# Patient Record
Sex: Female | Born: 1945
Health system: Southern US, Community
[De-identification: ages and names within clinical notes are randomized; demographics above are authoritative.]

## PROBLEM LIST (undated history)

## (undated) DIAGNOSIS — N3010 Interstitial cystitis (chronic) without hematuria: Secondary | ICD-10-CM

## (undated) DIAGNOSIS — L900 Lichen sclerosus et atrophicus: Secondary | ICD-10-CM

## (undated) DIAGNOSIS — M797 Fibromyalgia: Secondary | ICD-10-CM

## (undated) DIAGNOSIS — M8580 Other specified disorders of bone density and structure, unspecified site: Secondary | ICD-10-CM

## (undated) HISTORY — DX: Fibromyalgia: M79.7

## (undated) HISTORY — PX: BILATERAL SALPINGOOPHORECTOMY: SHX1223

## (undated) HISTORY — DX: Lichen sclerosus et atrophicus: L90.0

## (undated) HISTORY — DX: Interstitial cystitis (chronic) without hematuria: N30.10

## (undated) HISTORY — DX: Other specified disorders of bone density and structure, unspecified site: M85.80

## (undated) HISTORY — PX: HYSTEROSCOPY: SHX211

## (undated) HISTORY — PX: BREAST BIOPSY: SHX20

---

## 1998-02-28 ENCOUNTER — Encounter: Admission: RE | Admit: 1998-02-28 | Discharge: 1998-05-29 | Payer: Self-pay | Admitting: Anesthesiology

## 1998-06-12 ENCOUNTER — Encounter: Payer: Self-pay | Admitting: Obstetrics and Gynecology

## 1998-06-12 ENCOUNTER — Ambulatory Visit (HOSPITAL_COMMUNITY): Admission: RE | Admit: 1998-06-12 | Discharge: 1998-06-12 | Payer: Self-pay | Admitting: Obstetrics and Gynecology

## 1998-07-11 ENCOUNTER — Ambulatory Visit (HOSPITAL_BASED_OUTPATIENT_CLINIC_OR_DEPARTMENT_OTHER): Admission: RE | Admit: 1998-07-11 | Discharge: 1998-07-11 | Payer: Self-pay

## 1999-06-03 ENCOUNTER — Ambulatory Visit (HOSPITAL_COMMUNITY): Admission: RE | Admit: 1999-06-03 | Discharge: 1999-06-03 | Payer: Self-pay | Admitting: Obstetrics and Gynecology

## 1999-06-03 ENCOUNTER — Encounter: Payer: Self-pay | Admitting: Obstetrics and Gynecology

## 1999-06-24 ENCOUNTER — Other Ambulatory Visit: Admission: RE | Admit: 1999-06-24 | Discharge: 1999-06-24 | Payer: Self-pay | Admitting: Obstetrics and Gynecology

## 1999-06-27 ENCOUNTER — Ambulatory Visit (HOSPITAL_COMMUNITY): Admission: RE | Admit: 1999-06-27 | Discharge: 1999-06-27 | Payer: Self-pay | Admitting: Gastroenterology

## 2000-06-03 ENCOUNTER — Encounter: Payer: Self-pay | Admitting: Obstetrics and Gynecology

## 2000-06-03 ENCOUNTER — Encounter: Admission: RE | Admit: 2000-06-03 | Discharge: 2000-06-03 | Payer: Self-pay | Admitting: Obstetrics and Gynecology

## 2000-07-15 ENCOUNTER — Other Ambulatory Visit: Admission: RE | Admit: 2000-07-15 | Discharge: 2000-07-15 | Payer: Self-pay | Admitting: Obstetrics and Gynecology

## 2000-11-25 ENCOUNTER — Encounter: Admission: RE | Admit: 2000-11-25 | Discharge: 2000-11-25 | Payer: Self-pay | Admitting: Obstetrics and Gynecology

## 2000-11-25 ENCOUNTER — Encounter: Payer: Self-pay | Admitting: Obstetrics and Gynecology

## 2001-06-07 ENCOUNTER — Encounter: Payer: Self-pay | Admitting: Obstetrics and Gynecology

## 2001-06-07 ENCOUNTER — Encounter: Admission: RE | Admit: 2001-06-07 | Discharge: 2001-06-07 | Payer: Self-pay | Admitting: Obstetrics and Gynecology

## 2001-07-15 ENCOUNTER — Other Ambulatory Visit: Admission: RE | Admit: 2001-07-15 | Discharge: 2001-07-15 | Payer: Self-pay | Admitting: Obstetrics and Gynecology

## 2001-12-13 ENCOUNTER — Encounter: Payer: Self-pay | Admitting: Obstetrics and Gynecology

## 2001-12-13 ENCOUNTER — Encounter: Admission: RE | Admit: 2001-12-13 | Discharge: 2001-12-13 | Payer: Self-pay | Admitting: Obstetrics and Gynecology

## 2002-05-18 ENCOUNTER — Encounter: Payer: Self-pay | Admitting: Internal Medicine

## 2002-05-18 ENCOUNTER — Encounter: Admission: RE | Admit: 2002-05-18 | Discharge: 2002-05-18 | Payer: Self-pay | Admitting: Internal Medicine

## 2002-05-19 ENCOUNTER — Ambulatory Visit (HOSPITAL_COMMUNITY): Admission: RE | Admit: 2002-05-19 | Discharge: 2002-05-19 | Payer: Self-pay | Admitting: Gastroenterology

## 2002-06-12 ENCOUNTER — Encounter: Payer: Self-pay | Admitting: Obstetrics and Gynecology

## 2002-06-12 ENCOUNTER — Encounter: Admission: RE | Admit: 2002-06-12 | Discharge: 2002-06-12 | Payer: Self-pay | Admitting: Obstetrics and Gynecology

## 2002-07-17 ENCOUNTER — Other Ambulatory Visit: Admission: RE | Admit: 2002-07-17 | Discharge: 2002-07-17 | Payer: Self-pay | Admitting: Obstetrics and Gynecology

## 2002-07-27 HISTORY — PX: COMBINED HYSTEROSCOPY DIAGNOSTIC / D&C: SUR297

## 2002-12-14 ENCOUNTER — Encounter: Admission: RE | Admit: 2002-12-14 | Discharge: 2002-12-14 | Payer: Self-pay | Admitting: Obstetrics and Gynecology

## 2002-12-14 ENCOUNTER — Encounter: Payer: Self-pay | Admitting: Obstetrics and Gynecology

## 2003-01-17 ENCOUNTER — Encounter: Admission: RE | Admit: 2003-01-17 | Discharge: 2003-01-17 | Payer: Self-pay | Admitting: Internal Medicine

## 2003-01-17 ENCOUNTER — Encounter: Payer: Self-pay | Admitting: Internal Medicine

## 2003-01-25 ENCOUNTER — Observation Stay (HOSPITAL_COMMUNITY): Admission: EM | Admit: 2003-01-25 | Discharge: 2003-01-26 | Payer: Self-pay | Admitting: Obstetrics and Gynecology

## 2003-01-25 ENCOUNTER — Encounter (INDEPENDENT_AMBULATORY_CARE_PROVIDER_SITE_OTHER): Payer: Self-pay | Admitting: Specialist

## 2003-02-22 ENCOUNTER — Ambulatory Visit (HOSPITAL_COMMUNITY): Admission: RE | Admit: 2003-02-22 | Discharge: 2003-02-22 | Payer: Self-pay | Admitting: Gastroenterology

## 2003-02-22 ENCOUNTER — Encounter (INDEPENDENT_AMBULATORY_CARE_PROVIDER_SITE_OTHER): Payer: Self-pay | Admitting: Specialist

## 2003-06-14 ENCOUNTER — Encounter: Admission: RE | Admit: 2003-06-14 | Discharge: 2003-06-14 | Payer: Self-pay | Admitting: Obstetrics and Gynecology

## 2003-06-27 ENCOUNTER — Other Ambulatory Visit: Admission: RE | Admit: 2003-06-27 | Discharge: 2003-06-27 | Payer: Self-pay | Admitting: Obstetrics and Gynecology

## 2003-09-03 ENCOUNTER — Encounter: Admission: RE | Admit: 2003-09-03 | Discharge: 2003-09-03 | Payer: Self-pay | Admitting: Internal Medicine

## 2003-09-25 ENCOUNTER — Ambulatory Visit (HOSPITAL_COMMUNITY): Admission: RE | Admit: 2003-09-25 | Discharge: 2003-09-25 | Payer: Self-pay | Admitting: Urology

## 2003-09-25 ENCOUNTER — Encounter (INDEPENDENT_AMBULATORY_CARE_PROVIDER_SITE_OTHER): Payer: Self-pay | Admitting: Specialist

## 2003-09-25 ENCOUNTER — Ambulatory Visit (HOSPITAL_BASED_OUTPATIENT_CLINIC_OR_DEPARTMENT_OTHER): Admission: RE | Admit: 2003-09-25 | Discharge: 2003-09-25 | Payer: Self-pay | Admitting: Urology

## 2003-12-17 ENCOUNTER — Encounter: Admission: RE | Admit: 2003-12-17 | Discharge: 2003-12-17 | Payer: Self-pay | Admitting: Obstetrics and Gynecology

## 2004-06-16 ENCOUNTER — Encounter: Admission: RE | Admit: 2004-06-16 | Discharge: 2004-06-16 | Payer: Self-pay | Admitting: Obstetrics and Gynecology

## 2004-07-01 ENCOUNTER — Other Ambulatory Visit: Admission: RE | Admit: 2004-07-01 | Discharge: 2004-07-01 | Payer: Self-pay | Admitting: Obstetrics and Gynecology

## 2004-07-27 HISTORY — PX: ROTATOR CUFF REPAIR: SHX139

## 2005-06-24 ENCOUNTER — Encounter: Admission: RE | Admit: 2005-06-24 | Discharge: 2005-06-24 | Payer: Self-pay | Admitting: Obstetrics and Gynecology

## 2005-07-02 ENCOUNTER — Other Ambulatory Visit: Admission: RE | Admit: 2005-07-02 | Discharge: 2005-07-02 | Payer: Self-pay | Admitting: Obstetrics and Gynecology

## 2005-07-27 HISTORY — PX: LAPAROSCOPIC ASSISTED VAGINAL HYSTERECTOMY: SHX5398

## 2006-04-05 ENCOUNTER — Encounter (INDEPENDENT_AMBULATORY_CARE_PROVIDER_SITE_OTHER): Payer: Self-pay | Admitting: *Deleted

## 2006-04-05 ENCOUNTER — Ambulatory Visit (HOSPITAL_COMMUNITY): Admission: RE | Admit: 2006-04-05 | Discharge: 2006-04-06 | Payer: Self-pay | Admitting: Obstetrics and Gynecology

## 2006-07-01 ENCOUNTER — Encounter: Admission: RE | Admit: 2006-07-01 | Discharge: 2006-07-01 | Payer: Self-pay | Admitting: Obstetrics and Gynecology

## 2007-06-13 ENCOUNTER — Other Ambulatory Visit: Admission: RE | Admit: 2007-06-13 | Discharge: 2007-06-13 | Payer: Self-pay | Admitting: Obstetrics and Gynecology

## 2007-07-04 ENCOUNTER — Encounter: Admission: RE | Admit: 2007-07-04 | Discharge: 2007-07-04 | Payer: Self-pay | Admitting: Obstetrics and Gynecology

## 2007-08-12 ENCOUNTER — Encounter: Admission: RE | Admit: 2007-08-12 | Discharge: 2007-08-12 | Payer: Self-pay | Admitting: Internal Medicine

## 2008-06-14 ENCOUNTER — Ambulatory Visit: Payer: Self-pay | Admitting: Obstetrics and Gynecology

## 2008-06-14 ENCOUNTER — Other Ambulatory Visit: Admission: RE | Admit: 2008-06-14 | Discharge: 2008-06-14 | Payer: Self-pay | Admitting: Obstetrics and Gynecology

## 2008-06-14 ENCOUNTER — Encounter: Payer: Self-pay | Admitting: Obstetrics and Gynecology

## 2008-07-04 ENCOUNTER — Encounter: Admission: RE | Admit: 2008-07-04 | Discharge: 2008-07-04 | Payer: Self-pay | Admitting: Obstetrics and Gynecology

## 2008-07-16 ENCOUNTER — Encounter: Admission: RE | Admit: 2008-07-16 | Discharge: 2008-07-16 | Payer: Self-pay | Admitting: Internal Medicine

## 2009-07-05 ENCOUNTER — Encounter: Admission: RE | Admit: 2009-07-05 | Discharge: 2009-07-05 | Payer: Self-pay | Admitting: Obstetrics and Gynecology

## 2009-07-08 ENCOUNTER — Ambulatory Visit: Payer: Self-pay | Admitting: Obstetrics and Gynecology

## 2009-07-08 ENCOUNTER — Other Ambulatory Visit: Admission: RE | Admit: 2009-07-08 | Discharge: 2009-07-08 | Payer: Self-pay | Admitting: Obstetrics and Gynecology

## 2009-10-09 ENCOUNTER — Ambulatory Visit: Payer: Self-pay | Admitting: Obstetrics and Gynecology

## 2009-10-18 ENCOUNTER — Ambulatory Visit: Payer: Self-pay | Admitting: Obstetrics and Gynecology

## 2010-01-29 ENCOUNTER — Ambulatory Visit: Payer: Self-pay | Admitting: Obstetrics and Gynecology

## 2010-04-24 ENCOUNTER — Ambulatory Visit (HOSPITAL_COMMUNITY): Admission: RE | Admit: 2010-04-24 | Discharge: 2010-04-24 | Payer: Self-pay | Admitting: Gastroenterology

## 2010-07-07 ENCOUNTER — Encounter
Admission: RE | Admit: 2010-07-07 | Discharge: 2010-07-07 | Payer: Self-pay | Source: Home / Self Care | Attending: Obstetrics and Gynecology | Admitting: Obstetrics and Gynecology

## 2010-07-10 ENCOUNTER — Other Ambulatory Visit
Admission: RE | Admit: 2010-07-10 | Discharge: 2010-07-10 | Payer: Self-pay | Source: Home / Self Care | Admitting: Obstetrics and Gynecology

## 2010-07-10 ENCOUNTER — Ambulatory Visit: Payer: Self-pay | Admitting: Obstetrics and Gynecology

## 2010-10-09 LAB — BASIC METABOLIC PANEL
BUN: 16 mg/dL (ref 6–23)
CO2: 22 mEq/L (ref 19–32)
Calcium: 9 mg/dL (ref 8.4–10.5)
Chloride: 111 mEq/L (ref 96–112)
Creatinine, Ser: 0.83 mg/dL (ref 0.4–1.2)
GFR calc Af Amer: >60 mL/min (ref >60)
GFR calc non Af Amer: >60 mL/min (ref >60)
Glucose, Bld: 99 mg/dL (ref 70–99)
Potassium: 3.6 mEq/L (ref 3.5–5.1)
Sodium: 141 mEq/L (ref 135–145)

## 2010-10-09 LAB — CBC
HCT: 42.5 % (ref 36.0–46.0)
Hemoglobin: 14.2 g/dL (ref 12.0–15.0)
MCH: 30.5 pg (ref 26.0–34.0)
MCHC: 33.5 g/dL (ref 30.0–36.0)
MCV: 91.1 fL (ref 78.0–100.0)
Platelets: 215 10*3/uL (ref 150–400)
RBC: 4.67 MIL/uL (ref 3.87–5.11)
RDW: 13.1 % (ref 11.5–15.5)
WBC: 6.2 10*3/uL (ref 4.0–10.5)

## 2010-12-12 NOTE — Op Note (Signed)
NAME:  Rebecca Newton, BANKER           ACCOUNT NO.:  [id number]   MEDICAL RECORD NO.:  [id number]          PATIENT TYPE:  AMB   LOCATION:  DAY                          FACILITY:  [facility]   PHYSICIAN:  Daniel L. Gottsegen, M.D.DATE OF BIRTH:  [birth year]   DATE OF PROCEDURE:  04/05/2006  DATE OF DISCHARGE:                                 OPERATIVE REPORT   PREOPERATIVE DIAGNOSES:  1. Chronic pelvic pain.  2. Fibroids.  3. Chronic postmenopausal bleeding.   POSTOPERATIVE DIAGNOSES:  1. Chronic pelvic pain.  2. Fibroids.  3. Chronic postmenopausal bleeding.   OPERATION:  Laparoscopic-assisted vaginal hysterectomy, bilateral salpingo-  oophorectomy.   SURGEON:  Daniel L. Eda Paschal, M.D.   FIRST ASSISTANT:  Juan H. Lily Peer, M.D.   FINDINGS AT SURGERY:  The patient had multiple small fibroids enlarging her  uterus; ovaries, fallopian tubes and pelvic peritoneum were free of disease.   PROCEDURE:  After adequate general orotracheal anesthesia, the patient was  placed in the dorsal supine position, prepped and draped in the usual  sterile manner.  Using an OptiVu with direct insertion, the peritoneal  cavity was entered with a 10 mm trocar attached to the laparoscope without  any trauma to the pelvis or the surrounding peritoneal structures.  Two 5 mm  ports were placed laterally and through these, a variety of instrumentation  was placed.  Peritoneal washings were obtained because of the history of  persistent postmenopausal bleeding and then using a bipolar cautery, the IP  ligaments were bipolared and cut.  This was done, isolating them from the  ureters.  Balance of this attachment of the adnexa to the broad ligament  were bipolared and cut.  The bladder flap was developed without difficulty.  At this point, the procedure was finished vaginally.  The patient was  repositioned and 1:200,000 solution of epinephrine and 0.50% Xylocaine was  injected around the cervix, a 360 degree  incision was made around the  cervix.  The bladder was mobilized superiorly as was the posterior  peritoneum.  Posterior peritoneum and vesicouterine fold of peritoneum were  entered without difficulty.  The uterosacral ligaments were clamped, in  clamping them they were shortened and then they were sutured to the vault  laterally for good vault support.  Cardinal ligaments, uterine arteries,  balance of the broad ligament were all clamped, cut and suture ligated with  #1 chromic catgut with the vascular bundle being doubly ligated.  Uterus,  ovaries and tubes were delivered intact and sent to pathology for tissue  diagnosis.  The vaginal cuff was sutured to the peritoneum with a running  locking 0 Vicryl, a modified McCall's enterocele suture was placed with 3-0  Vicryl and the cuff was closed with figure-of-eights of #1 chromic catgut.  Copious irrigation had been done with sterile saline.  The surgeons then  regloved, went back above, copious irrigation was done again, there was  absolutely no bleeding noted so the  procedure was terminated.  The trocars were removed.  The subumbilical  fascial incision was closed with 0 Vicryl and the skin was closed with Steri-  Strips.  The  patient tolerated the procedure well and left the operating  room in satisfactory condition draining clear urine from her Foley catheter.      Daniel L. Eda Paschal, M.D.  Electronically Signed     DLG/MEDQ  D:  04/05/2006  T:  04/06/2006  Job:  [id number]

## 2010-12-12 NOTE — Op Note (Signed)
   NAME:  Rebecca Newton, Rebecca Newton                        ACCOUNT NO.:  [id number]   MEDICAL RECORD NO.:  [id number]                   PATIENT TYPE:  AMB   LOCATION:  ENDO                                 FACILITY:  [facility]   PHYSICIAN:  Danise Edge, M.D.                DATE OF BIRTH:  [birth year]   DATE OF PROCEDURE:  02/22/2003  DATE OF DISCHARGE:                                 OPERATIVE REPORT   REFERRING PHYSICIAN:  Candyce Churn, M.D.   PROCEDURE:  Colonoscopy.   INDICATIONS FOR PROCEDURE:  [name] is a 65 year old female born  01/05/1946.  [name] has unexplained chronic pelvic pain with  nonbloody diarrhea alternating with constipation.  Her May 19, 2002,  esophagogastroduodenoscopy was normal.   ENDOSCOPIST:  Charolett Bumpers, M.D.   PREMEDICATION:  Versed 15 mg, Demerol 100 mg.   PROCEDURE:  After obtaining confirmed consent, [name] was placed in the  left lateral decubitus position.  I administered intravenous Demerol and  intravenous Versed to achieve conscious sedation for the procedure.  The  patient's blood pressure, oxygen saturation, and cardiac rhythm were  monitored throughout the procedure and documented in the medical record.  Anal inspection was normal.  Digital rectal exam was normal.  The Olympus  adjustable pediatric colonoscope was introduced into the rectum and easily  advanced to the cecum.  A normal appearing ileocecal valve was intubated and  the distal ileum inspected.  Colonic preparation for the exam today was  excellent.   Rectum normal.  Sigmoid colon and descending colon normal.  Splenic flexure normal.  Transverse colon normal.  Hepatic flexure normal.  Ascending colon normal.  Cecum and ileocecal valve normal.  Distal ileum normal.   Random colonic biopsies:  Approximately six biopsies were taken from both  the right and left colon and sent to the pathologist to look for signs of  microscopic -  collagenous colitis.    ASSESSMENT:  Normal proctocolonoscopy to the cecum with inspection of the  distal ileum.  Random colonic biopsies pending.                                               Danise Edge, M.D.    MJ/MEDQ  D:  02/22/2003  T:  02/22/2003  Job:  [id number]   cc:   Reuel Boom L. Eda Paschal, M.D.  [address]  [place]  [place] [address]  Fax: [phone]   Candyce Churn, M.D.  [address] [place]  [place] [address]  Fax: [phone]

## 2010-12-12 NOTE — Op Note (Signed)
NAME:  Rebecca Newton, Rebecca Newton                        ACCOUNT NO.:  [id number]   MEDICAL RECORD NO.:  [id number]                   PATIENT TYPE:  AMB   LOCATION:  N[facility]C                                 FACILITY:  [facility]   PHYSICIAN:  Jamison Neighbor, M.D.               DATE OF BIRTH:  20-Jun-1946   DATE OF PROCEDURE:  09/25/2003  DATE OF DISCHARGE:                                 OPERATIVE REPORT   SERVICE:  Urology.   PREOPERATIVE DIAGNOSES:  Chronic pelvic pain, probable interstitial  cystitis.   POSTOPERATIVE DIAGNOSES:  Chronic pelvic pain, probable interstitial  cystitis.   PROCEDURE:  Cystoscopy, urethral calibration, hydrodistention of the  bladder, bladder biopsy with cauterization, Marcaine and Pyridium  installation, Marcaine and Kenalog injection.   SURGEON:  Jamison Neighbor, M.D.   ANESTHESIA:  General.   COMPLICATIONS:  None.   DRAINS:  None.   BRIEF HISTORY:  This 65 year old female has had problems with chronic pelvic  pain. She has had an evaluation with an upper GI as well as colonoscopy and  had a hysteroscopy and resection of some benign tissue. The patient's pelvic  pain is severe enough that it is thought she might have interstitial  cystitis.  Based on initial examination, it was thought that she had  possible interstitial cystitis as well as what appeared to be some evidence  of vulvodynia and pelvic floor dysfunction. The patient did have a low  O'Leary symptom index score with a problem score of 6 and a severity score  of 6 but he had an elevated PUF score of 15 which is suggestive of IC.  The  patient was evaluated with a potassium test and the potassium does appear to  be positive.  We felt that the patient should be started on anesthetic  cocktails as well as a combination of Elmiron and Atarax.   The patient felt that she had not had as much improvement as she would like  so for that reason she has requested a hydrodistention. She hopes to achieve  some  degree of pelvic relief from this as well as confirming the diagnosis.  Once that has been completed, the patient does know that she is likely to  require physical therapy.  She understands the risks and benefits of the  procedure specifically the fact that it is likely that this will increase  her pain temporarily.  Full informed consent was obtained.   DESCRIPTION OF PROCEDURE:  After successful induction of general anesthesia,  the patient was placed in the dorsal lithotomy position and prepped with  Betadine and draped in the usual sterile fashion. Careful bimanual  examination revealed no significant abnormality. The patient really did not  have much in the way of cystocele or rectocele, there are no masses on  bimanual exam, there was no evidence of discharge or vaginitis. The urethra  was palpably normal with no signs of a diverticulum.  The urethra was  calibrated at 65 [place] with female urethral sounds with no evidence of  stenosis or stricture. The cystoscope was inserted, the bladder was  carefully inspected.  It was free of any tumor or stones. Both ureteral  orifices were normal in configuration and location. The bladder was  distended at a pressure of 100 cm of water for 5 minutes. When the bladder  was drained, the bladder capacity was found to be 800 mL which is somewhat  diminished from a normal bladder capacity of 1100 but is somewhat higher  than the average interstitial cystitis bladder capacity of 575.  Modest  glomerulations were seen and these were really quite nonspecific and no  ulcers could be seen. The fact that the patient has been on Elmiron may  limit to some degree the glomerulation formation. A bladder biopsy was taken  and will be sent for mast call analysis. The biopsy site was cauterized with  the Bugbee electrode.  A mixture of Marcaine and Pyridium was left within  the bladder.  Marcaine and Kenalog were injected periurethrally. A B&O  suppository was  inserted. The patient tolerated the procedure well and was  taken to the recovery room in good condition.  She will be sent home with  Demerol since she is allergic to most other pain medications. She will be  given Pyridium plus for symptom management and Levaquin for three days. When  the patient returns will assess the results of the hydrodistention.  It is  anticipated that we will continue the patient on her combination of  medications i.e. the Elmiron and Atarax but we may need to also add  something for neurogenic inflammation such as Elavil or Neurontin.  In  addition, the patient will be started on therapy for pelvic floor  dysfunction.                                               Jamison Neighbor, M.D.    RJE/MEDQ  D:  09/25/2003  T:  09/25/2003  Job:  [id number]   cc:   Candyce Churn, M.D.  [address] [place]  [place] [address]  Fax: [phone]   Danise Edge, M.D.  [address]  [place]  [place] [address]  Fax: [phone]   Rande Brunt. Eda Paschal, M.D.  [address]  [place]  [place] [address]  Fax: [phone]

## 2010-12-12 NOTE — Discharge Summary (Signed)
NAMEANJULIE, Rebecca Newton              ACCOUNT NO.:  [id number]   MEDICAL RECORD NO.:  [id number]          PATIENT TYPE:  OIB   LOCATION:  1604                         FACILITY:  [facility]   PHYSICIAN:  Daniel L. Gottsegen, M.D.DATE OF BIRTH:  [birth year]   DATE OF ADMISSION:  04/05/2006  DATE OF DISCHARGE:  04/06/2006                                 DISCHARGE SUMMARY   The patient is a 65 year old female who was admitted to the hospital with  severe chronic pelvic pain, fibroids, and chronic postmenopausal bleeding  for definitive surgery.  On the day of admission, she underwent a  laparoscopically-assisted vaginal hysterectomy, bilateral salpingo-  oophorectomy.  Postoperatively, she did well and by the first postoperative day she was  ready for discharge.   She was discharged on pain medication.   She will return to the office in 3 weeks.   FINAL PATHOLOGY:  Report revealed a uterus with multiple fibroids,  adenomyosis, ovaries and tubes were benign.   POSTOPERATIVE CONDITION:  Improved.   DISCHARGE DIAGNOSES:  1,  Chronic pelvic pain with multiple leiomyomas and  adenomyosis.  1. Chronic postmenopausal bleeding.   OPERATION:  Laparoscopic-assisted vaginal hysterectomy, bilateral salpingo-  oophorectomy.      Daniel L. Eda Paschal, M.D.  Electronically Signed     DLG/MEDQ  D:  04/27/2006  T:  04/28/2006  Job:  [id number]

## 2010-12-12 NOTE — Op Note (Signed)
Rebecca Newton, Rebecca Newton                        ACCOUNT NO.:  [id number]   MEDICAL RECORD NO.:  [id number]                   PATIENT TYPE:  AMB   LOCATION:  [facility]C                                 FACILITY:  [facility]   PHYSICIAN:  Daniel L. Eda Paschal, M.D.           DATE OF BIRTH:  28-May-1946   DATE OF PROCEDURE:  01/25/2003  DATE OF DISCHARGE:                                 OPERATIVE REPORT   PREOPERATIVE DIAGNOSIS:  Pelvic pain and large endometrial cavity.   POSTOPERATIVE DIAGNOSIS:  1. Pelvic pain and large endometrial cavity.  2. Endometrial polyp.   OPERATION/PROCEDURE:  1. Hysteroscopy.  2. Dilatation and curettage.  3. Resection of endometrial polyp.   SURGEON:  Daniel L. Eda Paschal, M.D.   ANESTHESIA:  General.   INDICATIONS:  The patient is a 65 year old female who presented to her  internist with a history of chronic lower pelvic pain.  Ultrasound was done  which showed several small myomas.  There was no adnexal pathology but her  endometrial cavity was enlarged, not consistent with her history of being  postmenopausal and on no hormonal therapy.  An attempt was made to do an  endometrial biopsy in the office which was unsuccessful because her cervix  was completely stenotic and closed over.  She now enters the hospital for  hysteroscopy and endometrial sampling and removal of any intrauterine  pathology.   FINDINGS:  External vaginal exam was within normal limits.  Cervix is very  stenotic and there is a small dimple where the external os should be but  there is absolutely no opening whatsoever.  Uterus is slightly enlarged with  small myomas.  Adnexa reveal no masses.  The patient has 1.5 degrees of  uterine descensus.  At the time of hysteroscopy, once the patient was  dilated, her cavity appeared normal, consistent with a postmenopausal state  except for a small, what was thought to be an endometrial polyp, coming off  at the top of the fundus near the right  cornual area.  It was approximately  1 cm.  Other than that, there were no other abnormal findings at the time of  hysteroscopy, looking meticulously at both the intrauterine surfaces as well  as the intracervical surfaces.   DESCRIPTION OF PROCEDURE:  After adequate general anesthesia, the patient  was placed in the dorsal lithotomy position and prepped and draped in the  usual sterile manner.  A single-tooth tenaculum was placed in the anterior  lip of the cervix.  Because there was really no opening or any way to start  an opening with a small dilator, a scalpel was taken and incision was made  over the external os dimple.  At this point the patient could be dilated.  Initially it was exceedingly difficult to do so.  Two single-tooth  tenaculums were placed, one at 12 o'clock and one at 6 o'clock to spread out  the tension.  Finally  after approximately 20 minutes of being very slow and  carefully meticulous, she was dilated to #21 [facility] dilator.  At this time a  diagnostic hysteroscopy was performed and the only abnormal finding was a  small polyp at the top of the fundus.  Three percent sorbitol was used to  expand the intrauterine cavity and a camera was used for magnification.  After this was done, because it was felt that it was very difficult to  dilate her any further, an attempt was made to remove the polyp both with  Randall polyp forceps and several different size curets but this was not  successful.  The little bit of endometrial tissue that was obtained was sent  to pathology for tissue diagnosis.  At this point again being careful and  going slowly, she was dilated to a #31 [employer] dilator and then a  hysteroscopic examination was redone with the hysteroscopic resectoscope.  A  90-degree wire loop was used.  Bovie settings were 70 coag, 110 cutting,  blend 1. The polyp was reidentified and it could be removed in pieces with  the resectoscope and was sent to pathology for  tissue diagnosis.  At the  termination of the procedure, there was no bleeding noted.  Estimated blood  loss for the entire procedure was 20 mL with none replaced.  Fluid deficit  was between 200 and 300 mL but there was a leak in the bag and it was felt  that a lot of that was fluid that had gone on the floor.  The patient  tolerated the procedure well and left the operating room in satisfactory  condition.                                                Daniel L. Eda Paschal, M.D.    Tonette Bihari  D:  01/25/2003  T:  01/25/2003  Job:  [id number]

## 2010-12-12 NOTE — Op Note (Signed)
   NAME:  Rebecca Newton, Rebecca Newton                        ACCOUNT NO.:  [id number]   MEDICAL RECORD NO.:  [id number]                   PATIENT TYPE:  AMB   LOCATION:  ENDO                                 FACILITY:  [facility]   PHYSICIAN:  Danise Edge, M.D.                DATE OF BIRTH:  November 13, 1945   DATE OF PROCEDURE:  05/19/2002  DATE OF DISCHARGE:                                 OPERATIVE REPORT   PROCEDURE:  Esophagogastroduodenoscopy.   INDICATIONS:  The patient is a 65 year old female, born 04/22/1946.  The patient is undergoing a diagnostic esophagogastroduodenoscopy to  evaluate upper abdominal discomfort. Dr. Kevan Ny is concerned she may have  pill induced esophageal injury.   ENDOSCOPIST:  Danise Edge, M.D.   PREMEDICATION:  Versed 10 mg, Demerol 100 mg.   ENDOSCOPE:  Olympus gastroscope.   DESCRIPTION OF PROCEDURE:  After informed consent was obtained the patient  was placed in the left lateral decubitus position. I administered  intravenous Demerol and intravenous Versed to achieve conscious sedation for  the procedure. The patient's blood pressure, O2 saturation an cardiac rhythm  were monitored throughout the procedure and documented in the medical  record.   The Olympus gastroscope was passed through the posterior hypopharynx and  into the proximal esophagus without difficulty. The hypopharynx, larynx and  vocal cords appeared normal.     Esophagoscopy:  The proximal mid and lower segments of the esophagus  appeared completely normal. The squamocolumnar junction and esophagogastric  junction are noted at 38 cm from the incisor teeth.   Gastroscopy:  A retroflexed view of the gastric cardia and fundus was  normal. The gastric body, antrum and pylorus appeared normal.   Duodenoscopy:  The duodenal bulb, mid duodenum and distal duodenum appeared  normal.   ASSESSMENT:  Normal esophagogastroduodenoscopy.        Danise Edge, M.D.    MJ/MEDQ  D:  05/19/2002  T:  05/20/2002  Job:  [id number]   cc:   Candyce Churn, MD  [address] [place]  [place] [address]  Fax: [phone]

## 2010-12-12 NOTE — H&P (Signed)
NAMEDORLEEN, KISSEL NO.:  [id number]   MEDICAL RECORD NO.:  [id number]          PATIENT TYPE:   LOCATION:                                 FACILITY:   PHYSICIAN:  Daniel L. Gottsegen, M.D.DATE OF BIRTH:  04/05/2007   DATE OF ADMISSION:  DATE OF DISCHARGE:                                HISTORY & PHYSICAL   CHIEF COMPLAINT:  Chronic pelvic pain, intermittent postmenopausal bleeding.   HISTORY OF PRESENT ILLNESS:  The patient is a 65 year old gravida 1, para 1,  AB 0, who now has over a three year history of chronic pelvic pain  associated with intermittent vaginal bleeding.  Initially when she was seen  for this problem in June 2004, ultrasound revealed three fibroids.  She at  that point was postmenopausal and it was not clear in our mind whether that  was causing the pain as she was not on hormonal replacement therapy.  She  did not have adnexal disease at that point; however, she did have a very  enlarged endometrial stripe for a postmenopausal woman not on estrogen.  She  underwent a hysteroscopy.  Hysteroscopy revealed an endometrial polyp which  was excised.  Postoperatively after the surgery, her pelvic pain continued.  Because of concern as to whether it was adhesive disease, fibroids, or  endometriosis causing it, it was elected to look for other sources of the  pain.  She has had a complete GI evaluation including colonoscopy, all of  which was normal.  She was found to have interstitial cystitis and it was  felt that certainly could be contributing to the above.  She has been under  the care of Dr. Marcelyn Bruins since then.  He has diagnosed her with  interstitial cystitis; however, she has been very well treated.  She can now  differentiate the interstitial cystitis pain from this other chronic pain,  and the other pain continues to grow.  She has been re-scanned.  Her largest  fibroid when she was re-scanned had increased in size, which also was  somewhat disconcerting as a result of the fact she does not take hormonal  replacement therapy.  She has not had intermittent vaginal bleeding over the  past two to three months.  It is associated with this lower abdominal  cramping which seems to come and go, and which again the patient does not  feel feels like her interstitial cystitis at all.  Endometrial biopsy was  done because of the bleeding and it was benign.  Because of the persistence  of the above, we have decided to proceed with laparoscopic-assisted vaginal  hysterectomy, bilateral salpingo-oophorectomy for persistent chronic pain,  non-GI/non-GU, and intermittent vaginal bleeding non-responsive to the above  therapy.  She now enters the hospital for the above.   PAST MEDICAL HISTORY:  The patient has a history of interstitial cystitis  and fibromyalgia.   MEDICATIONS:  Present medications are:  1. Valium 10 mg p.r.n.  2. Elmiron 100 mg, 2 b.i.d.  3. Pyridium.  4. Vytorin.  5. Hydroxyzine HCL 25 mg, 1 at bedtime.  6. Flexeril 10  mg t.i.d. p.r.n.  7. Ambien 10 mg at bedtime.  8. Topamax 100 mg b.i.d. for her migraines.   ALLERGIES:  She is allergic to [facility].   SOCIAL HISTORY:  She is a previous [employer] administrator.  She is a nonsmoker.  She  does drink a little bit of alcohol.   FAMILY HISTORY:  Her mother died of lung cancer.  Other than that, family  history is noncontributory.   REVIEW OF SYSTEMS:  HEENT:  Reveals a history of chronic migraines.  CARDIOVASCULAR:  The patient does have an elevated cholesterol and takes  Statins.  RESPIRATORY:  Negative.  GI:  Negative.  MUSCULOSKELETAL:  Reveals  a history of fibromyalgia.  GU:  Reveals interstitial cystitis.  NEUROLOGICAL/PSYCHIATRIC:  Negative.  ALLERGIC/IMMUNOLOGIC/LYMPHATIC/ENDOCRINE:  Negative.   PHYSICAL EXAMINATION:  GENERAL APPEARANCE:  The patient is a well-developed,  well-nourished female in no acute distress.  VITAL SIGNS:  Her blood pressure is  138/74 mmHg.  Her pulse is 80 and  regular.  Respirations are 16 and non-labored.  She is febrile.  HEENT:  All within normal limits.  NECK:  Supple.  Trachea in the midline.  Thyroid is not enlarged.  LUNGS:  Clear to P&A.  HEART:  No thrills, heaves, or murmurs.  BREASTS:  No masses.  ABDOMEN:  Soft without guarding, rebound, or masses.  PELVIC:  External is normal.  BUS is normal.  Vaginal is normal.  Cervix is  clean, with a Pap showing no atypia.  Uterus is slightly enlarged by small,  multiple myomas.  Adnexa fail to reveal masses.  RECTOVAGINAL:  Confirmatory.   ADMISSION IMPRESSION:  Persistent, chronic postmenopausal pain with  intermittent vaginal bleeding, other sources of the above ruled out.   PLAN:  LVAH, BSO.      Daniel L. Eda Paschal, M.D.  Electronically Signed     DLG/MEDQ  D:  04/05/2006  T:  04/05/2006  Job:  [id number]

## 2011-05-20 ENCOUNTER — Telehealth: Payer: Self-pay | Admitting: *Deleted

## 2011-05-20 ENCOUNTER — Other Ambulatory Visit: Payer: Self-pay | Admitting: *Deleted

## 2011-05-20 DIAGNOSIS — N644 Mastodynia: Secondary | ICD-10-CM

## 2011-05-20 NOTE — Telephone Encounter (Signed)
Patient called and wants an order sent for bilateral diagnostic mammo with ultrasounds.  Patient said she has always had a diagnostic done.  She currently has pain the the right breast around 3 oclock, and pain to the left at the nipple.  She said you have always had a diagnostic done in the past.  Patient due for annual in December.  Can we send order?

## 2011-05-20 NOTE — Telephone Encounter (Signed)
Order sent patient informed

## 2011-05-20 NOTE — Telephone Encounter (Signed)
yes

## 2011-06-24 ENCOUNTER — Encounter: Payer: Self-pay | Admitting: *Deleted

## 2011-06-24 DIAGNOSIS — M797 Fibromyalgia: Secondary | ICD-10-CM | POA: Insufficient documentation

## 2011-06-24 DIAGNOSIS — M8580 Other specified disorders of bone density and structure, unspecified site: Secondary | ICD-10-CM | POA: Insufficient documentation

## 2011-06-24 DIAGNOSIS — N3010 Interstitial cystitis (chronic) without hematuria: Secondary | ICD-10-CM | POA: Insufficient documentation

## 2011-07-10 ENCOUNTER — Ambulatory Visit
Admission: RE | Admit: 2011-07-10 | Discharge: 2011-07-10 | Disposition: A | Discharge status: Home / Self Care | Payer: 59 | Source: Ambulatory Visit | Attending: Obstetrics and Gynecology | Admitting: Obstetrics and Gynecology

## 2011-07-10 ENCOUNTER — Ambulatory Visit: Payer: Self-pay

## 2011-07-10 DIAGNOSIS — N644 Mastodynia: Secondary | ICD-10-CM

## 2011-07-14 ENCOUNTER — Encounter: Payer: Self-pay | Admitting: Obstetrics and Gynecology

## 2011-07-14 ENCOUNTER — Ambulatory Visit (INDEPENDENT_AMBULATORY_CARE_PROVIDER_SITE_OTHER): Payer: 59 | Admitting: Obstetrics and Gynecology

## 2011-07-14 VITALS — BP 120/76 | Ht 62.0 in | Wt 150.0 lb

## 2011-07-14 DIAGNOSIS — M8580 Other specified disorders of bone density and structure, unspecified site: Secondary | ICD-10-CM

## 2011-07-14 DIAGNOSIS — Z01419 Encounter for gynecological examination (general) (routine) without abnormal findings: Secondary | ICD-10-CM

## 2011-07-14 DIAGNOSIS — M949 Disorder of cartilage, unspecified: Secondary | ICD-10-CM

## 2011-07-14 MED ORDER — FLUCONAZOLE 150 MG PO TABS: 150.0000 mg | ORAL_TABLET | Freq: Once | ORAL | Status: AC

## 2011-07-14 NOTE — Progress Notes (Signed)
The patient came back to see me today for her annual GYN exam. She tried [place] last year for her bone loss but had terrible GERD with it. She had originally tried [place] and also had side effects with it. Her dentist did not want her to go on Reclast because every time he fills a cavity she needs a root canal.her last bone density showed a borderline elevated FRAX risk of 19%. She has had no fractures. She takes calcium and vitamin D. We continue to give her diazepam for anxiety and Diflucan when she gets a yeast infection. She is up-to-date on mammograms. She's having no vaginal bleeding or pelvic pain.  Physical examination: Rebecca Newton present HEENT within normal limits. Neck: Thyroid not large. No masses. Supraclavicular nodes: not enlarged. Breasts: Examined in both sitting midline position. No skin changes and no masses. Abdomen: Soft no guarding rebound or masses or hernia. Pelvic: External: Within normal limits. BUS: Within normal limits. Vaginal:within normal limits. Good estrogen effect. No evidence of cystocele rectocele or enterocele. Cervix: clean. Uterus: Normal size and shape. Adnexa: No masses. Rectovaginal exam: Confirmatory and negative. Extremities: Within normal limits.  Assessment: Low bone mass  Plan: For the moment patient declined therapy. She will get her bone density in March and then we will reconsider Reclast pending results. Diazepam 10 mg tablets #50 with four refills.

## 2011-08-20 ENCOUNTER — Telehealth: Payer: Self-pay | Admitting: *Deleted

## 2011-08-20 NOTE — Telephone Encounter (Signed)
Pt was seen on 07/14/11 for annual and give Diazepam 10 mg tablets #50 with four refills & diflucan 150 mg 7 tablets 5 refills. She was also given handwritten Rx. See hard copy chart. Pt said that she knows nonething about this medication? I didn't see in last office note as well. Please advise.

## 2011-08-20 NOTE — Telephone Encounter (Signed)
I assume she is talking about the danazol. It is used for breast tenderness. In reviewing her encounter I do not see that we discussed that would I gave her that prescription. Assuming she is not having breast tenderness she should not get it filled. I'm not sure why she got the prescription. My guess is that it was for another patient and was mistakenly given to her. Please ask her about the breast tenderness and let me know.

## 2011-08-20 NOTE — Telephone Encounter (Signed)
Pt said that she doesn't need rx and will not fill.

## 2011-10-14 DIAGNOSIS — R300 Dysuria: Secondary | ICD-10-CM | POA: Insufficient documentation

## 2011-10-14 DIAGNOSIS — R3915 Urgency of urination: Secondary | ICD-10-CM | POA: Insufficient documentation

## 2011-10-14 DIAGNOSIS — K589 Irritable bowel syndrome without diarrhea: Secondary | ICD-10-CM | POA: Insufficient documentation

## 2011-10-14 DIAGNOSIS — R5382 Chronic fatigue, unspecified: Secondary | ICD-10-CM | POA: Insufficient documentation

## 2011-10-14 DIAGNOSIS — R52 Pain, unspecified: Secondary | ICD-10-CM | POA: Insufficient documentation

## 2011-10-14 DIAGNOSIS — R109 Unspecified abdominal pain: Secondary | ICD-10-CM | POA: Insufficient documentation

## 2011-10-14 DIAGNOSIS — R350 Frequency of micturition: Secondary | ICD-10-CM | POA: Insufficient documentation

## 2011-10-14 DIAGNOSIS — G43709 Chronic migraine without aura, not intractable, without status migrainosus: Secondary | ICD-10-CM | POA: Insufficient documentation

## 2011-12-02 ENCOUNTER — Ambulatory Visit (INDEPENDENT_AMBULATORY_CARE_PROVIDER_SITE_OTHER): Payer: 59

## 2011-12-02 DIAGNOSIS — M8580 Other specified disorders of bone density and structure, unspecified site: Secondary | ICD-10-CM

## 2011-12-02 DIAGNOSIS — M949 Disorder of cartilage, unspecified: Secondary | ICD-10-CM

## 2011-12-09 ENCOUNTER — Encounter: Payer: Self-pay | Admitting: Obstetrics and Gynecology

## 2012-03-15 DIAGNOSIS — N9489 Other specified conditions associated with female genital organs and menstrual cycle: Secondary | ICD-10-CM | POA: Diagnosis not present

## 2012-03-21 DIAGNOSIS — E782 Mixed hyperlipidemia: Secondary | ICD-10-CM | POA: Diagnosis not present

## 2012-03-21 DIAGNOSIS — B009 Herpesviral infection, unspecified: Secondary | ICD-10-CM | POA: Diagnosis not present

## 2012-03-21 DIAGNOSIS — R609 Edema, unspecified: Secondary | ICD-10-CM | POA: Diagnosis not present

## 2012-03-21 DIAGNOSIS — G4700 Insomnia, unspecified: Secondary | ICD-10-CM | POA: Diagnosis not present

## 2012-03-21 DIAGNOSIS — K219 Gastro-esophageal reflux disease without esophagitis: Secondary | ICD-10-CM | POA: Diagnosis not present

## 2012-03-21 DIAGNOSIS — F411 Generalized anxiety disorder: Secondary | ICD-10-CM | POA: Diagnosis not present

## 2012-03-21 DIAGNOSIS — N9489 Other specified conditions associated with female genital organs and menstrual cycle: Secondary | ICD-10-CM | POA: Diagnosis not present

## 2012-03-21 DIAGNOSIS — E669 Obesity, unspecified: Secondary | ICD-10-CM | POA: Diagnosis not present

## 2012-04-12 ENCOUNTER — Ambulatory Visit (INDEPENDENT_AMBULATORY_CARE_PROVIDER_SITE_OTHER): Payer: Medicare Other | Admitting: Obstetrics and Gynecology

## 2012-04-12 ENCOUNTER — Encounter: Payer: Self-pay | Admitting: Obstetrics and Gynecology

## 2012-04-12 DIAGNOSIS — B373 Candidiasis of vulva and vagina: Secondary | ICD-10-CM | POA: Diagnosis not present

## 2012-04-12 DIAGNOSIS — L293 Anogenital pruritus, unspecified: Secondary | ICD-10-CM | POA: Diagnosis not present

## 2012-04-12 DIAGNOSIS — N952 Postmenopausal atrophic vaginitis: Secondary | ICD-10-CM

## 2012-04-12 DIAGNOSIS — N898 Other specified noninflammatory disorders of vagina: Secondary | ICD-10-CM

## 2012-04-12 LAB — WET PREP FOR TRICH, YEAST, CLUE
Clue Cells Wet Prep HPF POC: NONE SEEN
Trich, Wet Prep: NONE SEEN
Yeast Wet Prep HPF POC: NONE SEEN

## 2012-04-12 MED ORDER — ESTROGENS, CONJUGATED 0.625 MG/GM VA CREA: TOPICAL_CREAM | VAGINAL | Status: DC

## 2012-04-12 NOTE — Patient Instructions (Signed)
Return to the office in 2 weeks 

## 2012-04-12 NOTE — Progress Notes (Signed)
Patient came to see me today with a history of vulvar and vaginal itching of several months duration. She went through PCP who diagnosed her with both atrophic vaginitis and yeast vaginitis. The patient used 3 Diflucan prior to that visit and had gotten some relief. She was treated by them with Premarin vaginal cream and nystatin and has had intermittent relief but is still having tremendous vulvar itching. She was also given a prescription for terconazole 3 cream which she did not fill. She's also had some lower abdominal pressure and they thought she might have a urinary tract infection and treat her with Pyridium and she said she would go talk to Dr. Logan Bores about this. So far she has not seen him.  Exam: Beola Cord present. External shows both vulvitis and some leukoplakia on the inner labia. BUS: Within normal limits. Vaginal exam: Some atrophic changes. Also consistent with yeast although wet prep negative.  Assessment: #1. Leukoplakia #2. Atrophic vaginitis #3. Yeast vaginitis  Plan: Terconazole 3 cream. Use externally and internally for 3 days. Patient has prescription. Continue Premarin vaginal cream when done with terconazole. Return in 2 weeks. Hopefully the area that looked like leukoplakia is just related to the yeast. If still present we will biopsy. We will recheck her urine in 2 weeks.

## 2012-04-26 ENCOUNTER — Ambulatory Visit: Payer: Medicare Other | Admitting: Obstetrics and Gynecology

## 2012-04-27 ENCOUNTER — Ambulatory Visit (INDEPENDENT_AMBULATORY_CARE_PROVIDER_SITE_OTHER): Payer: Medicare Other | Admitting: Obstetrics and Gynecology

## 2012-04-27 ENCOUNTER — Ambulatory Visit: Payer: BLUE CROSS/BLUE SHIELD | Admitting: Obstetrics and Gynecology

## 2012-04-27 DIAGNOSIS — R300 Dysuria: Secondary | ICD-10-CM | POA: Diagnosis not present

## 2012-04-27 DIAGNOSIS — R5381 Other malaise: Secondary | ICD-10-CM | POA: Diagnosis not present

## 2012-04-27 DIAGNOSIS — R5383 Other fatigue: Secondary | ICD-10-CM | POA: Diagnosis not present

## 2012-04-27 DIAGNOSIS — M542 Cervicalgia: Secondary | ICD-10-CM | POA: Diagnosis not present

## 2012-04-27 DIAGNOSIS — G4700 Insomnia, unspecified: Secondary | ICD-10-CM | POA: Diagnosis not present

## 2012-04-27 DIAGNOSIS — IMO0002 Reserved for concepts with insufficient information to code with codable children: Secondary | ICD-10-CM

## 2012-04-27 DIAGNOSIS — M5430 Sciatica, unspecified side: Secondary | ICD-10-CM | POA: Diagnosis not present

## 2012-04-27 DIAGNOSIS — N904 Leukoplakia of vulva: Secondary | ICD-10-CM

## 2012-04-27 DIAGNOSIS — L940 Localized scleroderma [morphea]: Secondary | ICD-10-CM | POA: Diagnosis not present

## 2012-04-27 DIAGNOSIS — L988 Other specified disorders of the skin and subcutaneous tissue: Secondary | ICD-10-CM | POA: Diagnosis not present

## 2012-04-27 NOTE — Progress Notes (Addendum)
Patient came to see me today for further followup. She is still having vulvar and vaginal itching. She is using a Premarin cream. She has not seen Dr. Logan Bores.  Exam: Kennon Portela present. External: Leukoplakia is still present on inner  right labia.  Assessment: #1. Leukoplakia #2. Dysuria  Plan: Urinalysis obtained. A biopsy of the leukoplakia was done. The area was prepped and draped in a sterile manner using Betadine. The area was anesthetized with 1% plain Xylocaine. A punch biopsy was obtained. The base was treated with Monsel's to stop the bleeding. She was instructed on postoperative care. We will call her with pathology report and treatment.  Addendum: Patient's biopsy came back showing lichen sclerosis. She will use Temovate ointment twice a day for one week. She knows she can get reoccurrences. Refills given. Patient also had a urinary tract infection. She was treated with ampicillin 500  mg twice a day for 7 days. She will then return for urine culture.

## 2012-04-27 NOTE — Patient Instructions (Signed)
I will call you with report

## 2012-04-28 LAB — URINALYSIS W MICROSCOPIC + REFLEX CULTURE
Bilirubin Urine: NEGATIVE
Glucose, UA: NEGATIVE mg/dL
Ketones, ur: NEGATIVE mg/dL
Protein, ur: NEGATIVE mg/dL
Urobilinogen, UA: 0.2 mg/dL (ref 0.0–1.0)

## 2012-04-30 LAB — URINE CULTURE

## 2012-05-03 DIAGNOSIS — N904 Leukoplakia of vulva: Secondary | ICD-10-CM | POA: Insufficient documentation

## 2012-05-03 MED ORDER — CLOBETASOL PROPIONATE 0.05 % EX OINT: TOPICAL_OINTMENT | Freq: Two times a day (BID) | CUTANEOUS | Status: DC

## 2012-05-03 MED ORDER — AMPICILLIN 500 MG PO CAPS: 500.0000 mg | ORAL_CAPSULE | Freq: Two times a day (BID) | ORAL | Status: DC

## 2012-05-03 NOTE — Addendum Note (Signed)
Addended by: Trellis Paganini on: 05/03/2012 11:11 AM   Modules accepted: Orders

## 2012-05-13 ENCOUNTER — Other Ambulatory Visit: Payer: Self-pay | Admitting: Obstetrics and Gynecology

## 2012-05-16 ENCOUNTER — Telehealth: Payer: Self-pay | Admitting: *Deleted

## 2012-05-16 NOTE — Telephone Encounter (Signed)
Pt called requesting to have diag. Mammogram, pt informed she will need OV, pt will make appointment.

## 2012-05-17 ENCOUNTER — Telehealth: Payer: Self-pay | Admitting: *Deleted

## 2012-05-17 ENCOUNTER — Encounter: Payer: Self-pay | Admitting: Obstetrics and Gynecology

## 2012-05-17 ENCOUNTER — Ambulatory Visit (INDEPENDENT_AMBULATORY_CARE_PROVIDER_SITE_OTHER): Payer: Medicare Other | Admitting: Obstetrics and Gynecology

## 2012-05-17 ENCOUNTER — Other Ambulatory Visit: Payer: Medicare Other

## 2012-05-17 DIAGNOSIS — N6019 Diffuse cystic mastopathy of unspecified breast: Secondary | ICD-10-CM

## 2012-05-17 DIAGNOSIS — N644 Mastodynia: Secondary | ICD-10-CM | POA: Diagnosis not present

## 2012-05-17 DIAGNOSIS — N390 Urinary tract infection, site not specified: Secondary | ICD-10-CM | POA: Diagnosis not present

## 2012-05-17 LAB — URINALYSIS W MICROSCOPIC + REFLEX CULTURE
Casts: NONE SEEN
Glucose, UA: NEGATIVE mg/dL
Leukocytes, UA: NEGATIVE
Protein, ur: NEGATIVE mg/dL
WBC, UA: NONE SEEN WBC/hpf (ref <3)
pH: 5 (ref 5.0–8.0)

## 2012-05-17 NOTE — Telephone Encounter (Signed)
Order placed

## 2012-05-17 NOTE — Progress Notes (Signed)
Patient came to see me today with complaining of extreme breast tenderness diffusely in both breasts. She's also noticed some increasing "density"diffusely in both her breasts. What she means by that is hardening of the tissue. This occurred after she was given progesterone with her estrogen cream by her PCP. She stopped the progesterone. She had her last mammogram in December, 2012. We Have  Treated  her for urinary tract infection and she is now asymptomatic and her urinalysis today was normal.  Exam: Rebecca Newton present.Both breasts were carefully examined in the sitting and lying positions. There are no skin changes. There are no masses palpated.  Assessment: Mastodynia  Plan: Diagnostic mammogram of both breasts.

## 2012-05-17 NOTE — Telephone Encounter (Signed)
Message copied by Aura Camps on Tue May 17, 2012  3:39 PM ------      Message from: Trellis Paganini      Created: Tue May 17, 2012  3:22 PM       Please schedule patient for bilateral diagnostic mammogram for mastodynia and hardening of breast tissue.

## 2012-05-17 NOTE — Patient Instructions (Signed)
We will call you with mammogram time.

## 2012-05-18 ENCOUNTER — Telehealth: Payer: Self-pay | Admitting: *Deleted

## 2012-05-18 MED ORDER — DIAZEPAM 10 MG PO TABS: 10.0000 mg | ORAL_TABLET | Freq: Four times a day (QID) | ORAL | Status: DC | PRN

## 2012-05-18 NOTE — Telephone Encounter (Signed)
Chart in office

## 2012-05-18 NOTE — Addendum Note (Signed)
Addended by: Aura Camps on: 05/18/2012 12:31 PM   Modules accepted: Orders

## 2012-05-18 NOTE — Telephone Encounter (Signed)
Appointment 05/24/12 with breast center

## 2012-05-18 NOTE — Telephone Encounter (Signed)
Gives her 10 mg tablets. Number is 50 with 4 refills. Tell her that should last one year.

## 2012-05-18 NOTE — Telephone Encounter (Signed)
Chart

## 2012-05-18 NOTE — Telephone Encounter (Signed)
rx called in

## 2012-05-18 NOTE — Telephone Encounter (Signed)
Pt calling requesting refill on Valium 10 mg tablets with refills. Pt has annual schedule 07/18/12. Please advise

## 2012-05-24 ENCOUNTER — Ambulatory Visit
Admission: RE | Admit: 2012-05-24 | Discharge: 2012-05-24 | Disposition: A | Discharge status: Home / Self Care | Payer: Medicare Other | Source: Ambulatory Visit | Attending: Obstetrics and Gynecology | Admitting: Obstetrics and Gynecology

## 2012-05-24 DIAGNOSIS — N644 Mastodynia: Secondary | ICD-10-CM | POA: Diagnosis not present

## 2012-05-24 DIAGNOSIS — Z803 Family history of malignant neoplasm of breast: Secondary | ICD-10-CM | POA: Diagnosis not present

## 2012-06-01 DIAGNOSIS — E782 Mixed hyperlipidemia: Secondary | ICD-10-CM | POA: Diagnosis not present

## 2012-06-01 DIAGNOSIS — R609 Edema, unspecified: Secondary | ICD-10-CM | POA: Diagnosis not present

## 2012-06-01 DIAGNOSIS — Z0000 Encounter for general adult medical examination without abnormal findings: Secondary | ICD-10-CM | POA: Diagnosis not present

## 2012-06-01 DIAGNOSIS — Z79899 Other long term (current) drug therapy: Secondary | ICD-10-CM | POA: Diagnosis not present

## 2012-06-01 DIAGNOSIS — Z1331 Encounter for screening for depression: Secondary | ICD-10-CM | POA: Diagnosis not present

## 2012-06-01 DIAGNOSIS — F411 Generalized anxiety disorder: Secondary | ICD-10-CM | POA: Diagnosis not present

## 2012-06-01 DIAGNOSIS — K219 Gastro-esophageal reflux disease without esophagitis: Secondary | ICD-10-CM | POA: Diagnosis not present

## 2012-06-01 DIAGNOSIS — G4700 Insomnia, unspecified: Secondary | ICD-10-CM | POA: Diagnosis not present

## 2012-06-01 DIAGNOSIS — N9489 Other specified conditions associated with female genital organs and menstrual cycle: Secondary | ICD-10-CM | POA: Diagnosis not present

## 2012-07-18 ENCOUNTER — Other Ambulatory Visit (HOSPITAL_COMMUNITY)
Admission: RE | Admit: 2012-07-18 | Discharge: 2012-07-18 | Disposition: A | Discharge status: Home / Self Care | Payer: Medicare Other | Source: Ambulatory Visit | Attending: Obstetrics and Gynecology | Admitting: Obstetrics and Gynecology

## 2012-07-18 ENCOUNTER — Encounter: Payer: Self-pay | Admitting: Obstetrics and Gynecology

## 2012-07-18 ENCOUNTER — Ambulatory Visit (INDEPENDENT_AMBULATORY_CARE_PROVIDER_SITE_OTHER): Payer: Medicare Other | Admitting: Obstetrics and Gynecology

## 2012-07-18 VITALS — BP 124/78 | Ht 63.0 in | Wt 166.0 lb

## 2012-07-18 DIAGNOSIS — M949 Disorder of cartilage, unspecified: Secondary | ICD-10-CM | POA: Diagnosis not present

## 2012-07-18 DIAGNOSIS — Z124 Encounter for screening for malignant neoplasm of cervix: Secondary | ICD-10-CM | POA: Insufficient documentation

## 2012-07-18 DIAGNOSIS — N6019 Diffuse cystic mastopathy of unspecified breast: Secondary | ICD-10-CM | POA: Diagnosis not present

## 2012-07-18 DIAGNOSIS — N390 Urinary tract infection, site not specified: Secondary | ICD-10-CM

## 2012-07-18 DIAGNOSIS — N952 Postmenopausal atrophic vaginitis: Secondary | ICD-10-CM | POA: Diagnosis not present

## 2012-07-18 DIAGNOSIS — L940 Localized scleroderma [morphea]: Secondary | ICD-10-CM

## 2012-07-18 DIAGNOSIS — L900 Lichen sclerosus et atrophicus: Secondary | ICD-10-CM

## 2012-07-18 DIAGNOSIS — N871 Moderate cervical dysplasia: Secondary | ICD-10-CM

## 2012-07-18 DIAGNOSIS — M8580 Other specified disorders of bone density and structure, unspecified site: Secondary | ICD-10-CM

## 2012-07-18 MED ORDER — CLOBETASOL PROPIONATE 0.05 % EX OINT: TOPICAL_OINTMENT | Freq: Two times a day (BID) | CUTANEOUS | Status: DC

## 2012-07-18 MED ORDER — DIAZEPAM 10 MG PO TABS: 10.0000 mg | ORAL_TABLET | Freq: Four times a day (QID) | ORAL | Status: DC | PRN

## 2012-07-18 NOTE — Addendum Note (Signed)
Addended by: Dayna Barker on: 07/18/2012 09:25 AM   Modules accepted: Orders

## 2012-07-18 NOTE — Progress Notes (Signed)
Patient came to see me today for further followup. In October she had an area of leukoplakia with itching. Vulvar biopsy showed lichen sclerosis. It has responded to Temovate. She also had a urinary tract infection which responded to antibiotics. Followup urinalysis was normal. Patient is currently without dysuria, frequency or urgency. She has no urinary incontinence. She is using Premarin vaginal cream for atrophic vaginitis with excellent results. She just had a normal mammogram. She does have fibrocystic disease of her breast. She has no masses or mastodynia currently. Her last bone density was 2013 and showed stable osteopenia without an elevated fracture risk. She previously used biphosphonate  Therapy. She has had no fractures. Greater than 30 years ago she had cervical dysplasia which responded to cryosurgery. She has had normal Pap smears since then. Her last Pap smear was 2011. We give her Valium for stress. In 2007 she had a laparoscopic-assisted vaginal hysterectomy and bilateral salpingo-oophorectomy for adenomyosis and fibroids which were symptomatic. She is having no vaginal bleeding. She is having no pelvic pain.  ROS: 12 systems reviewed done. Pertinent positives above. Other positives include interstitial cystitis and fibromyalgia.  HEENT: Within normal limits.Kennon Portela present. Neck: No masses. Supraclavicular lymph nodes: Not enlarged. Breasts: Examined in both sitting and lying position. Symmetrical without skin changes or masses. Abdomen: Soft no masses guarding or rebound. No hernias. Pelvic: External within normal limits. BUS within normal limits. Vaginal examination shows good estrogen effect, no cystocele enterocele or rectocele. Cervix and uterus absent. Adnexa within normal limits. Rectovaginal confirmatory. Extremities within normal limits.  Assessment: #1. Atrophic vaginitis #2. Osteopenia #3. Fibrocystic breast disease #4. Lichen sclerosis #5. Urinary tract infection  now resolved #6. CIN.  Plan: Continue mammograms yearly-start 3-D. Continue periodic bone densities. Continue Premarin vaginal cream. Continue Temovate when necessary. Continue Valium. Pap done.The new Pap smear guidelines were discussed with the patient.

## 2012-07-18 NOTE — Patient Instructions (Signed)
Get a 3-D mammogram in 2014.

## 2012-09-13 DIAGNOSIS — R5381 Other malaise: Secondary | ICD-10-CM | POA: Diagnosis not present

## 2012-09-13 DIAGNOSIS — M545 Low back pain: Secondary | ICD-10-CM | POA: Diagnosis not present

## 2012-09-13 DIAGNOSIS — M542 Cervicalgia: Secondary | ICD-10-CM | POA: Diagnosis not present

## 2012-09-13 DIAGNOSIS — R5383 Other fatigue: Secondary | ICD-10-CM | POA: Diagnosis not present

## 2012-09-13 DIAGNOSIS — IMO0001 Reserved for inherently not codable concepts without codable children: Secondary | ICD-10-CM | POA: Diagnosis not present

## 2012-10-03 DIAGNOSIS — R609 Edema, unspecified: Secondary | ICD-10-CM | POA: Diagnosis not present

## 2012-10-03 DIAGNOSIS — G4700 Insomnia, unspecified: Secondary | ICD-10-CM | POA: Diagnosis not present

## 2012-10-03 DIAGNOSIS — E669 Obesity, unspecified: Secondary | ICD-10-CM | POA: Diagnosis not present

## 2012-11-02 DIAGNOSIS — N94819 Vulvodynia, unspecified: Secondary | ICD-10-CM | POA: Diagnosis not present

## 2012-11-02 DIAGNOSIS — IMO0001 Reserved for inherently not codable concepts without codable children: Secondary | ICD-10-CM | POA: Diagnosis not present

## 2012-11-02 DIAGNOSIS — G43709 Chronic migraine without aura, not intractable, without status migrainosus: Secondary | ICD-10-CM | POA: Diagnosis not present

## 2012-11-02 DIAGNOSIS — R3915 Urgency of urination: Secondary | ICD-10-CM | POA: Diagnosis not present

## 2012-11-02 DIAGNOSIS — R300 Dysuria: Secondary | ICD-10-CM | POA: Diagnosis not present

## 2012-11-02 DIAGNOSIS — R5382 Chronic fatigue, unspecified: Secondary | ICD-10-CM | POA: Diagnosis not present

## 2012-11-02 DIAGNOSIS — R350 Frequency of micturition: Secondary | ICD-10-CM | POA: Diagnosis not present

## 2012-11-02 DIAGNOSIS — K589 Irritable bowel syndrome without diarrhea: Secondary | ICD-10-CM | POA: Diagnosis not present

## 2012-12-23 DIAGNOSIS — E782 Mixed hyperlipidemia: Secondary | ICD-10-CM | POA: Diagnosis not present

## 2012-12-30 DIAGNOSIS — IMO0001 Reserved for inherently not codable concepts without codable children: Secondary | ICD-10-CM | POA: Diagnosis not present

## 2013-01-05 ENCOUNTER — Other Ambulatory Visit: Payer: Self-pay | Admitting: Obstetrics and Gynecology

## 2013-01-05 NOTE — Telephone Encounter (Signed)
Former patient of Dr. Timoteo Expose and is current with [facility].  At 07/18/12 [facility] he wrote "We give her Valium for stress.".

## 2013-01-06 NOTE — Telephone Encounter (Signed)
CALLED INTO PHARMACY

## 2013-01-23 ENCOUNTER — Ambulatory Visit (INDEPENDENT_AMBULATORY_CARE_PROVIDER_SITE_OTHER): Payer: Medicare Other | Admitting: Gynecology

## 2013-01-23 ENCOUNTER — Encounter: Payer: Self-pay | Admitting: Gynecology

## 2013-01-23 DIAGNOSIS — L940 Localized scleroderma [morphea]: Secondary | ICD-10-CM | POA: Diagnosis not present

## 2013-01-23 DIAGNOSIS — N952 Postmenopausal atrophic vaginitis: Secondary | ICD-10-CM

## 2013-01-23 DIAGNOSIS — L293 Anogenital pruritus, unspecified: Secondary | ICD-10-CM

## 2013-01-23 DIAGNOSIS — L900 Lichen sclerosus et atrophicus: Secondary | ICD-10-CM

## 2013-01-23 DIAGNOSIS — L292 Pruritus vulvae: Secondary | ICD-10-CM

## 2013-01-23 MED ORDER — CLOBETASOL PROPIONATE 0.05 % EX CREA: TOPICAL_CREAM | CUTANEOUS | Status: DC

## 2013-01-23 MED ORDER — FLUCONAZOLE 200 MG PO TABS: 200.0000 mg | ORAL_TABLET | Freq: Every day | ORAL | Status: DC

## 2013-01-23 NOTE — Progress Notes (Signed)
Patient presents complaining of vaginal itching and irritation. She has a history of recurrent yeast infections and does take Diflucan intermittently. Also biopsy proven lichen sclerosis although has not used her Temovate cream recently. Lasley history of atrophic vaginitis and was prescribed Premarin vaginal cream but admits to not using it routinely. Most recent episode started several days ago seems to be getting worse. Has tried over-the-counter athlete's foot cream externally but continues to have irritation.  Exam was Administrator, [employer] vagina with atrophic changes. Classic blanching white lichen sclerosis changes on the perineal body and lower labia minora bilaterally. No significant discharge. Mild cystocele. Cuff well supported. Mild rectocele. Bimanual without masses or tenderness.  Assessment and plan: Perineal vulvar itching patient was lichen sclerosis and history of recurrent yeast. No wet prep done his on going to cover her regardless with Diflucan 200 daily x3 days. #10 pills given to have available for use in the future. Temovate 0.05% cream nightly for 3-4 weeks and then taper and we'll see how she does with this. I discussed the use of Premarin. She does not chronically have vaginal dryness or dyspareunia. Need to use twice to 3 times weekly regardless to build up the effect was discussed and she does not want to do this and she will stop the cream.

## 2013-01-23 NOTE — Patient Instructions (Signed)
Take Diflucan pill daily for 3 days. Save the rest indication of recurrent yeast type symptoms. Upon Temovate cream nightly for 3-4 weeks then taper. Followup if your symptoms persist or recur. Followup in December when you're due for your annual.

## 2013-02-22 ENCOUNTER — Ambulatory Visit (INDEPENDENT_AMBULATORY_CARE_PROVIDER_SITE_OTHER): Payer: Medicare Other | Admitting: Gynecology

## 2013-02-22 ENCOUNTER — Encounter: Payer: Self-pay | Admitting: Gynecology

## 2013-02-22 DIAGNOSIS — R82998 Other abnormal findings in urine: Secondary | ICD-10-CM | POA: Diagnosis not present

## 2013-02-22 DIAGNOSIS — N760 Acute vaginitis: Secondary | ICD-10-CM

## 2013-02-22 DIAGNOSIS — L293 Anogenital pruritus, unspecified: Secondary | ICD-10-CM

## 2013-02-22 DIAGNOSIS — L940 Localized scleroderma [morphea]: Secondary | ICD-10-CM

## 2013-02-22 DIAGNOSIS — L900 Lichen sclerosus et atrophicus: Secondary | ICD-10-CM

## 2013-02-22 DIAGNOSIS — N898 Other specified noninflammatory disorders of vagina: Secondary | ICD-10-CM

## 2013-02-22 LAB — WET PREP FOR TRICH, YEAST, CLUE: Clue Cells Wet Prep HPF POC: NONE SEEN

## 2013-02-22 MED ORDER — TERCONAZOLE 0.4 % VA CREA: 1.0000 | TOPICAL_CREAM | Freq: Every day | VAGINAL | Status: DC

## 2013-02-22 NOTE — Progress Notes (Signed)
Patient presents complaining of persistent vulvar itching and irritation. Having intense itching particularly at night. Already uses Atarax 50 mg at bedtime. Is on Temovate cream nightly and took Diflucan 200 mg for 3 days and then repeated a course last week but had persistent itching. Says it feels just like when she has yeast infections.  Exam was Administrator, [employer] vagina with whitish skin changes along both labia minora and perineal body consistent with her diagnosis of lichen sclerosis. Generalized atrophic changes. Vagina normal scant discharge. Bimanual without masses or tenderness. Rectovaginal exam is normal.  Assessment and plan: Wet prep is negative. Persistent intense vaginal itching. Had been on Premarin vaginal cream but did not feel that this helped. Recommend Terazol 7 day cream internally and increasing her Temovate to twice daily. Followup if symptoms persist.

## 2013-02-22 NOTE — Patient Instructions (Signed)
Use Terazol vaginal cream internally for 7 nights. Use Temovate cream externally twice daily. Sitz baths in cool water with hair dryer drying afterwards.

## 2013-02-23 LAB — URINALYSIS W MICROSCOPIC + REFLEX CULTURE
Casts: NONE SEEN
Crystals: NONE SEEN
Nitrite: NEGATIVE
Specific Gravity, Urine: 1.023 (ref 1.005–1.030)
Squamous Epithelial / LPF: NONE SEEN
Urobilinogen, UA: 0.2 mg/dL (ref 0.0–1.0)
pH: 5.5 (ref 5.0–8.0)

## 2013-02-27 ENCOUNTER — Telehealth: Payer: Self-pay

## 2013-02-27 NOTE — Telephone Encounter (Signed)
I do not think anything more going to do is go to dramatically improve it overnight. She needs to be using the Temovate cream twice daily which may take at least a week to 2 weeks to kick in. The only thing more I can add would be after the Terazol is over to retry the Premarin cream but again using this would take several weeks to get a good effect because it needs to affect the growth of the skin which does not happen overnight.

## 2013-02-27 NOTE — Telephone Encounter (Signed)
Patient is using medications as prescribed and still has several days left to use the Terazol 7 but states she is miserable. She said for a day it seemed like it was getting better but yesterday started worsening and today is miserable. Took 4 Benadryl earlier trying to get the itching to subside. She asked if she should take more Atarax. She takes 2 hs already.   She said she had not been to the pool and had been trying to stay cool and dry.  She asked me to see what you recommend because nothing seems to be helping.

## 2013-02-27 NOTE — Telephone Encounter (Signed)
Patient informed. 

## 2013-03-02 ENCOUNTER — Telehealth: Payer: Self-pay

## 2013-03-02 ENCOUNTER — Ambulatory Visit (INDEPENDENT_AMBULATORY_CARE_PROVIDER_SITE_OTHER): Payer: Medicare Other | Admitting: Gynecology

## 2013-03-02 ENCOUNTER — Encounter: Payer: Self-pay | Admitting: Gynecology

## 2013-03-02 DIAGNOSIS — N760 Acute vaginitis: Secondary | ICD-10-CM

## 2013-03-02 LAB — WET PREP FOR TRICH, YEAST, CLUE
Clue Cells Wet Prep HPF POC: NONE SEEN
Trich, Wet Prep: NONE SEEN
Yeast Wet Prep HPF POC: NONE SEEN

## 2013-03-02 MED ORDER — METRONIDAZOLE 500 MG PO TABS: 500.0000 mg | ORAL_TABLET | Freq: Two times a day (BID) | ORAL | Status: DC

## 2013-03-02 MED ORDER — CLINDAMYCIN PHOSPHATE 2 % VA CREA: 1.0000 | TOPICAL_CREAM | Freq: Every day | VAGINAL | Status: DC

## 2013-03-02 MED ORDER — FLUCONAZOLE 200 MG PO TABS: 200.0000 mg | ORAL_TABLET | Freq: Every day | ORAL | Status: DC

## 2013-03-02 NOTE — Progress Notes (Signed)
Patient presents with persistent vaginal itching. Says it feels inside not outside. Complex history to include atrophic vaginitis and lichen sclerosis. Had used Premarin previously but stopped because she didn't think it was helping. Initially treated with Diflucan 200 mg x3 days and Temovate cream nightly. Subsequently treated with Terazol 7 day cream and Temovate twice daily. Said that the intense itching continues and it feels deep inside. Takes atarax at bedtime and has added Benadryl but still with a lot of itching.  Exam Kim Assistant Pelvic external BUS vagina with erythema along the vaginal walls. No external evidence of vulvitis. Cream also noted in the vagina. Bimanual without masses or tenderness  Assessment and plan: Persistent vaginitis. Continue with Temovate cream nightly. Cover with Flagyl 500 mg twice a day for bacterial vaginosis and Diflucan 200 mg daily for 5 days. Use medicated douche x1, followup if symptoms persist or recur. Alcohol avoidance reviewed.

## 2013-03-02 NOTE — Telephone Encounter (Signed)
She picked up Rx's and had a Clindamycin Vag Cm. Rx.  She also had the Diflucan Rx and Metronidazole Rx.  She wanted to make sure you wanted her to have the Clindamycin.  I did not see it on her medication list at all.

## 2013-03-02 NOTE — Telephone Encounter (Signed)
Just FYI.  When you order and sign it it goes on to pharmacy.  Even if you discontinue it in the chart you will need to let us know to call the pharmacy and tell them you cancelled it. They don't see that part.   Patient informed. She will take it back to the pharmacy.

## 2013-03-02 NOTE — Patient Instructions (Signed)
Take Diflucan pill daily for 5 days. Flagyl twice daily for 7 days, avoid alcohol while taking. Use medicated douche x1. Followup if symptoms persist, worsen or recur.

## 2013-03-02 NOTE — Addendum Note (Signed)
Addended by: Dayna Barker on: 03/02/2013 02:01 PM   Modules accepted: Orders

## 2013-03-02 NOTE — Telephone Encounter (Signed)
I thought I canceled the clindamycin order. I do not want her using the Cleocin  but just the two oral medications

## 2013-03-23 DIAGNOSIS — IMO0001 Reserved for inherently not codable concepts without codable children: Secondary | ICD-10-CM | POA: Diagnosis not present

## 2013-03-23 DIAGNOSIS — M545 Low back pain: Secondary | ICD-10-CM | POA: Diagnosis not present

## 2013-03-23 DIAGNOSIS — M542 Cervicalgia: Secondary | ICD-10-CM | POA: Diagnosis not present

## 2013-03-23 DIAGNOSIS — G4700 Insomnia, unspecified: Secondary | ICD-10-CM | POA: Diagnosis not present

## 2013-04-03 DIAGNOSIS — D485 Neoplasm of uncertain behavior of skin: Secondary | ICD-10-CM | POA: Diagnosis not present

## 2013-04-04 ENCOUNTER — Other Ambulatory Visit: Payer: Self-pay

## 2013-04-04 DIAGNOSIS — Z1231 Encounter for screening mammogram for malignant neoplasm of breast: Secondary | ICD-10-CM

## 2013-04-17 DIAGNOSIS — M545 Low back pain: Secondary | ICD-10-CM | POA: Diagnosis not present

## 2013-04-17 DIAGNOSIS — M542 Cervicalgia: Secondary | ICD-10-CM | POA: Diagnosis not present

## 2013-04-24 ENCOUNTER — Other Ambulatory Visit: Payer: Self-pay | Admitting: Orthopaedic Surgery

## 2013-04-24 ENCOUNTER — Ambulatory Visit
Admission: RE | Admit: 2013-04-24 | Discharge: 2013-04-24 | Disposition: A | Discharge status: Home / Self Care | Payer: Medicare Other | Source: Ambulatory Visit | Attending: Orthopaedic Surgery | Admitting: Orthopaedic Surgery

## 2013-04-24 DIAGNOSIS — R52 Pain, unspecified: Secondary | ICD-10-CM

## 2013-04-24 DIAGNOSIS — M25539 Pain in unspecified wrist: Secondary | ICD-10-CM | POA: Diagnosis not present

## 2013-04-24 DIAGNOSIS — R609 Edema, unspecified: Secondary | ICD-10-CM

## 2013-04-24 DIAGNOSIS — S52599A Other fractures of lower end of unspecified radius, initial encounter for closed fracture: Secondary | ICD-10-CM | POA: Diagnosis not present

## 2013-05-09 DIAGNOSIS — S52599A Other fractures of lower end of unspecified radius, initial encounter for closed fracture: Secondary | ICD-10-CM | POA: Diagnosis not present

## 2013-05-12 DIAGNOSIS — S52599A Other fractures of lower end of unspecified radius, initial encounter for closed fracture: Secondary | ICD-10-CM | POA: Diagnosis not present

## 2013-05-29 ENCOUNTER — Ambulatory Visit
Admission: RE | Admit: 2013-05-29 | Discharge: 2013-05-29 | Disposition: A | Discharge status: Home / Self Care | Payer: Medicare Other | Source: Ambulatory Visit

## 2013-05-29 DIAGNOSIS — Z1231 Encounter for screening mammogram for malignant neoplasm of breast: Secondary | ICD-10-CM | POA: Diagnosis not present

## 2013-06-05 DIAGNOSIS — B009 Herpesviral infection, unspecified: Secondary | ICD-10-CM | POA: Diagnosis not present

## 2013-06-05 DIAGNOSIS — IMO0001 Reserved for inherently not codable concepts without codable children: Secondary | ICD-10-CM | POA: Diagnosis not present

## 2013-06-05 DIAGNOSIS — E782 Mixed hyperlipidemia: Secondary | ICD-10-CM | POA: Diagnosis not present

## 2013-06-06 DIAGNOSIS — S52599A Other fractures of lower end of unspecified radius, initial encounter for closed fracture: Secondary | ICD-10-CM | POA: Diagnosis not present

## 2013-06-12 DIAGNOSIS — M6281 Muscle weakness (generalized): Secondary | ICD-10-CM | POA: Diagnosis not present

## 2013-06-12 DIAGNOSIS — M2550 Pain in unspecified joint: Secondary | ICD-10-CM | POA: Diagnosis not present

## 2013-06-12 DIAGNOSIS — M25639 Stiffness of unspecified wrist, not elsewhere classified: Secondary | ICD-10-CM | POA: Diagnosis not present

## 2013-06-12 DIAGNOSIS — M7989 Other specified soft tissue disorders: Secondary | ICD-10-CM | POA: Diagnosis not present

## 2013-06-15 DIAGNOSIS — M2550 Pain in unspecified joint: Secondary | ICD-10-CM | POA: Diagnosis not present

## 2013-06-15 DIAGNOSIS — M7989 Other specified soft tissue disorders: Secondary | ICD-10-CM | POA: Diagnosis not present

## 2013-06-15 DIAGNOSIS — M6281 Muscle weakness (generalized): Secondary | ICD-10-CM | POA: Diagnosis not present

## 2013-06-15 DIAGNOSIS — M25639 Stiffness of unspecified wrist, not elsewhere classified: Secondary | ICD-10-CM | POA: Diagnosis not present

## 2013-06-18 DIAGNOSIS — M2550 Pain in unspecified joint: Secondary | ICD-10-CM | POA: Diagnosis not present

## 2013-06-18 DIAGNOSIS — M7989 Other specified soft tissue disorders: Secondary | ICD-10-CM | POA: Diagnosis not present

## 2013-06-18 DIAGNOSIS — M25639 Stiffness of unspecified wrist, not elsewhere classified: Secondary | ICD-10-CM | POA: Diagnosis not present

## 2013-06-18 DIAGNOSIS — M6281 Muscle weakness (generalized): Secondary | ICD-10-CM | POA: Diagnosis not present

## 2013-06-19 DIAGNOSIS — M6281 Muscle weakness (generalized): Secondary | ICD-10-CM | POA: Diagnosis not present

## 2013-06-19 DIAGNOSIS — M7989 Other specified soft tissue disorders: Secondary | ICD-10-CM | POA: Diagnosis not present

## 2013-06-19 DIAGNOSIS — M25639 Stiffness of unspecified wrist, not elsewhere classified: Secondary | ICD-10-CM | POA: Diagnosis not present

## 2013-06-19 DIAGNOSIS — M2550 Pain in unspecified joint: Secondary | ICD-10-CM | POA: Diagnosis not present

## 2013-06-20 DIAGNOSIS — M25639 Stiffness of unspecified wrist, not elsewhere classified: Secondary | ICD-10-CM | POA: Diagnosis not present

## 2013-06-20 DIAGNOSIS — M2550 Pain in unspecified joint: Secondary | ICD-10-CM | POA: Diagnosis not present

## 2013-06-20 DIAGNOSIS — M6281 Muscle weakness (generalized): Secondary | ICD-10-CM | POA: Diagnosis not present

## 2013-06-20 DIAGNOSIS — M7989 Other specified soft tissue disorders: Secondary | ICD-10-CM | POA: Diagnosis not present

## 2013-06-26 DIAGNOSIS — M25639 Stiffness of unspecified wrist, not elsewhere classified: Secondary | ICD-10-CM | POA: Diagnosis not present

## 2013-06-26 DIAGNOSIS — M6281 Muscle weakness (generalized): Secondary | ICD-10-CM | POA: Diagnosis not present

## 2013-06-26 DIAGNOSIS — M2550 Pain in unspecified joint: Secondary | ICD-10-CM | POA: Diagnosis not present

## 2013-06-26 DIAGNOSIS — M7989 Other specified soft tissue disorders: Secondary | ICD-10-CM | POA: Diagnosis not present

## 2013-06-28 DIAGNOSIS — M7989 Other specified soft tissue disorders: Secondary | ICD-10-CM | POA: Diagnosis not present

## 2013-06-28 DIAGNOSIS — M2550 Pain in unspecified joint: Secondary | ICD-10-CM | POA: Diagnosis not present

## 2013-06-28 DIAGNOSIS — M6281 Muscle weakness (generalized): Secondary | ICD-10-CM | POA: Diagnosis not present

## 2013-06-28 DIAGNOSIS — M25639 Stiffness of unspecified wrist, not elsewhere classified: Secondary | ICD-10-CM | POA: Diagnosis not present

## 2013-07-03 DIAGNOSIS — M6281 Muscle weakness (generalized): Secondary | ICD-10-CM | POA: Diagnosis not present

## 2013-07-03 DIAGNOSIS — M7989 Other specified soft tissue disorders: Secondary | ICD-10-CM | POA: Diagnosis not present

## 2013-07-03 DIAGNOSIS — M2550 Pain in unspecified joint: Secondary | ICD-10-CM | POA: Diagnosis not present

## 2013-07-03 DIAGNOSIS — M25639 Stiffness of unspecified wrist, not elsewhere classified: Secondary | ICD-10-CM | POA: Diagnosis not present

## 2013-07-05 DIAGNOSIS — M2550 Pain in unspecified joint: Secondary | ICD-10-CM | POA: Diagnosis not present

## 2013-07-05 DIAGNOSIS — M7989 Other specified soft tissue disorders: Secondary | ICD-10-CM | POA: Diagnosis not present

## 2013-07-05 DIAGNOSIS — M6281 Muscle weakness (generalized): Secondary | ICD-10-CM | POA: Diagnosis not present

## 2013-07-05 DIAGNOSIS — M25639 Stiffness of unspecified wrist, not elsewhere classified: Secondary | ICD-10-CM | POA: Diagnosis not present

## 2013-07-07 DIAGNOSIS — M25639 Stiffness of unspecified wrist, not elsewhere classified: Secondary | ICD-10-CM | POA: Diagnosis not present

## 2013-07-07 DIAGNOSIS — M7989 Other specified soft tissue disorders: Secondary | ICD-10-CM | POA: Diagnosis not present

## 2013-07-07 DIAGNOSIS — M2550 Pain in unspecified joint: Secondary | ICD-10-CM | POA: Diagnosis not present

## 2013-07-07 DIAGNOSIS — M6281 Muscle weakness (generalized): Secondary | ICD-10-CM | POA: Diagnosis not present

## 2013-07-10 DIAGNOSIS — M7989 Other specified soft tissue disorders: Secondary | ICD-10-CM | POA: Diagnosis not present

## 2013-07-10 DIAGNOSIS — M25639 Stiffness of unspecified wrist, not elsewhere classified: Secondary | ICD-10-CM | POA: Diagnosis not present

## 2013-07-10 DIAGNOSIS — M6281 Muscle weakness (generalized): Secondary | ICD-10-CM | POA: Diagnosis not present

## 2013-07-10 DIAGNOSIS — M2550 Pain in unspecified joint: Secondary | ICD-10-CM | POA: Diagnosis not present

## 2013-07-12 DIAGNOSIS — M25639 Stiffness of unspecified wrist, not elsewhere classified: Secondary | ICD-10-CM | POA: Diagnosis not present

## 2013-07-12 DIAGNOSIS — M6281 Muscle weakness (generalized): Secondary | ICD-10-CM | POA: Diagnosis not present

## 2013-07-12 DIAGNOSIS — M7989 Other specified soft tissue disorders: Secondary | ICD-10-CM | POA: Diagnosis not present

## 2013-07-12 DIAGNOSIS — M2550 Pain in unspecified joint: Secondary | ICD-10-CM | POA: Diagnosis not present

## 2013-07-14 DIAGNOSIS — M2550 Pain in unspecified joint: Secondary | ICD-10-CM | POA: Diagnosis not present

## 2013-07-14 DIAGNOSIS — M6281 Muscle weakness (generalized): Secondary | ICD-10-CM | POA: Diagnosis not present

## 2013-07-14 DIAGNOSIS — M25639 Stiffness of unspecified wrist, not elsewhere classified: Secondary | ICD-10-CM | POA: Diagnosis not present

## 2013-07-14 DIAGNOSIS — M7989 Other specified soft tissue disorders: Secondary | ICD-10-CM | POA: Diagnosis not present

## 2013-07-16 DIAGNOSIS — M7989 Other specified soft tissue disorders: Secondary | ICD-10-CM | POA: Diagnosis not present

## 2013-07-16 DIAGNOSIS — M2550 Pain in unspecified joint: Secondary | ICD-10-CM | POA: Diagnosis not present

## 2013-07-16 DIAGNOSIS — M6281 Muscle weakness (generalized): Secondary | ICD-10-CM | POA: Diagnosis not present

## 2013-07-16 DIAGNOSIS — M25639 Stiffness of unspecified wrist, not elsewhere classified: Secondary | ICD-10-CM | POA: Diagnosis not present

## 2013-07-18 DIAGNOSIS — M7989 Other specified soft tissue disorders: Secondary | ICD-10-CM | POA: Diagnosis not present

## 2013-07-18 DIAGNOSIS — M25639 Stiffness of unspecified wrist, not elsewhere classified: Secondary | ICD-10-CM | POA: Diagnosis not present

## 2013-07-18 DIAGNOSIS — M6281 Muscle weakness (generalized): Secondary | ICD-10-CM | POA: Diagnosis not present

## 2013-07-18 DIAGNOSIS — M2550 Pain in unspecified joint: Secondary | ICD-10-CM | POA: Diagnosis not present

## 2013-07-26 ENCOUNTER — Encounter: Payer: Medicare Other | Admitting: Gynecology

## 2013-07-31 ENCOUNTER — Ambulatory Visit (INDEPENDENT_AMBULATORY_CARE_PROVIDER_SITE_OTHER): Payer: Medicare Other | Admitting: Gynecology

## 2013-07-31 ENCOUNTER — Encounter: Payer: Self-pay | Admitting: Gynecology

## 2013-07-31 VITALS — BP 120/66 | Ht 63.0 in

## 2013-07-31 DIAGNOSIS — M8580 Other specified disorders of bone density and structure, unspecified site: Secondary | ICD-10-CM

## 2013-07-31 DIAGNOSIS — M2550 Pain in unspecified joint: Secondary | ICD-10-CM | POA: Diagnosis not present

## 2013-07-31 DIAGNOSIS — L900 Lichen sclerosus et atrophicus: Secondary | ICD-10-CM

## 2013-07-31 DIAGNOSIS — F411 Generalized anxiety disorder: Secondary | ICD-10-CM | POA: Diagnosis not present

## 2013-07-31 DIAGNOSIS — M6281 Muscle weakness (generalized): Secondary | ICD-10-CM | POA: Diagnosis not present

## 2013-07-31 DIAGNOSIS — M949 Disorder of cartilage, unspecified: Secondary | ICD-10-CM

## 2013-07-31 DIAGNOSIS — N952 Postmenopausal atrophic vaginitis: Secondary | ICD-10-CM | POA: Diagnosis not present

## 2013-07-31 DIAGNOSIS — M7989 Other specified soft tissue disorders: Secondary | ICD-10-CM | POA: Diagnosis not present

## 2013-07-31 DIAGNOSIS — L940 Localized scleroderma [morphea]: Secondary | ICD-10-CM

## 2013-07-31 DIAGNOSIS — M899 Disorder of bone, unspecified: Secondary | ICD-10-CM | POA: Diagnosis not present

## 2013-07-31 DIAGNOSIS — M25639 Stiffness of unspecified wrist, not elsewhere classified: Secondary | ICD-10-CM | POA: Diagnosis not present

## 2013-07-31 MED ORDER — DIAZEPAM 10 MG PO TABS: 10.0000 mg | ORAL_TABLET | Freq: Four times a day (QID) | ORAL | Status: DC | PRN

## 2013-07-31 NOTE — Patient Instructions (Signed)
Follow up for bone density in 6 months Follow up for annual exam in one year.

## 2013-07-31 NOTE — Progress Notes (Signed)
Rebecca Newton 09-24-1945 [id number]        67 y.o.  G1P1001 for followupexam.  Former patient of Dr. Cherylann Banas. Several issues noted below.  Past medical history,surgical history, problem list, medications, allergies, family history and social history were all reviewed and documented in the EPIC chart.  ROS:  Performed and pertinent positives and negatives are included in the history, assessment and plan .  Exam: Kim assistant Filed Vitals:   07/31/13 1408  BP: 120/66  Height: 5\' 3"  (1.6 m)   General appearance  Normal Skin grossly normal Head/Neck normal with no cervical or supraclavicular adenopathy thyroid normal Lungs  clear Cardiac RR, without RMG Abdominal  soft, nontender, without masses, organomegaly or hernia Breasts  examined lying and sitting without masses, retractions, discharge or axillary adenopathy. Pelvic  Ext/BUS/vagina  Normal with atrophic changes. Lichenoid blanching of the skin lower bilateral labia and perineal region consistent with lichen sclerosus.   Adnexa  Without masses or tenderness    Anus and perineum  Normal   Rectovaginal  Normal sphincter tone without palpated masses or tenderness.    Assessment/Plan:  68 y.o. G45P1001 female for annual exam.   1. Postmenopausal/atrophic genital changes/lichen sclerosus. Status post LAVH BSO for leiomyoma/adenomyosis 2007. Is not having significant symptoms such as hot flashes, night sweats, vaginal dryness. Is not sexually active. Call if any issues. 2. Lichen sclerosus.  Biopsy-proven lichen sclerosus by Dr. Cherylann Banas in the past. Uses Temovate 0.05% cream intermittently for itching. Doing well with this. Will call when she needs a refill. 3. Osteopenia. DEXA 11/2013 with T score -1.5. FRAX 17%/1.2%. Repeat DEXA this coming summer at two-year interval. Increased calcium vitamin D reviewed. 4. Pap smear 2013. No Pap smear done today. History of cervical dysplasia "over 30 years ago" per Dr. Valeta Harms  note. No history of abnormal Pap smears since then. Reviewed current screening guidelines and we both agree on stop screening as she is over the age of 66 and status post hysterectomy. 5. Mammography 05/2013. Continue with annual mammography. 6. Colonoscopy 2011. Repeat at their recommended interval. 7. Anxiety. Patient uses Valium 10 mg when necessary anxiety and has done so for years per Dr. Cherylann Banas. Is not having side effects and I refilled her x1 year. #50x4 refills suffices for one year. 8. Health maintenance. No blood work done as this is done through her primary physician's office. Followup for bone density in 6 months otherwise one year for examination, sooner as needed.   Note: This document was prepared with digital dictation and possible smart phrase technology. Any transcriptional errors that result from this process are unintentional.   Anastasio Auerbach MD, 2:59 PM 07/31/2013

## 2013-08-02 ENCOUNTER — Encounter: Payer: Self-pay | Admitting: Obstetrics and Gynecology

## 2013-08-03 DIAGNOSIS — M6281 Muscle weakness (generalized): Secondary | ICD-10-CM | POA: Diagnosis not present

## 2013-08-03 DIAGNOSIS — M7989 Other specified soft tissue disorders: Secondary | ICD-10-CM | POA: Diagnosis not present

## 2013-08-03 DIAGNOSIS — M2550 Pain in unspecified joint: Secondary | ICD-10-CM | POA: Diagnosis not present

## 2013-08-03 DIAGNOSIS — M25639 Stiffness of unspecified wrist, not elsewhere classified: Secondary | ICD-10-CM | POA: Diagnosis not present

## 2013-08-11 DIAGNOSIS — M6281 Muscle weakness (generalized): Secondary | ICD-10-CM | POA: Diagnosis not present

## 2013-08-11 DIAGNOSIS — M2550 Pain in unspecified joint: Secondary | ICD-10-CM | POA: Diagnosis not present

## 2013-08-11 DIAGNOSIS — M7989 Other specified soft tissue disorders: Secondary | ICD-10-CM | POA: Diagnosis not present

## 2013-08-11 DIAGNOSIS — M25639 Stiffness of unspecified wrist, not elsewhere classified: Secondary | ICD-10-CM | POA: Diagnosis not present

## 2013-08-14 DIAGNOSIS — M25639 Stiffness of unspecified wrist, not elsewhere classified: Secondary | ICD-10-CM | POA: Diagnosis not present

## 2013-08-14 DIAGNOSIS — M6281 Muscle weakness (generalized): Secondary | ICD-10-CM | POA: Diagnosis not present

## 2013-08-14 DIAGNOSIS — M7989 Other specified soft tissue disorders: Secondary | ICD-10-CM | POA: Diagnosis not present

## 2013-08-14 DIAGNOSIS — M2550 Pain in unspecified joint: Secondary | ICD-10-CM | POA: Diagnosis not present

## 2013-08-17 DIAGNOSIS — M6281 Muscle weakness (generalized): Secondary | ICD-10-CM | POA: Diagnosis not present

## 2013-08-17 DIAGNOSIS — M2550 Pain in unspecified joint: Secondary | ICD-10-CM | POA: Diagnosis not present

## 2013-08-17 DIAGNOSIS — M25639 Stiffness of unspecified wrist, not elsewhere classified: Secondary | ICD-10-CM | POA: Diagnosis not present

## 2013-08-17 DIAGNOSIS — M7989 Other specified soft tissue disorders: Secondary | ICD-10-CM | POA: Diagnosis not present

## 2013-08-24 DIAGNOSIS — M6281 Muscle weakness (generalized): Secondary | ICD-10-CM | POA: Diagnosis not present

## 2013-08-24 DIAGNOSIS — R5381 Other malaise: Secondary | ICD-10-CM | POA: Diagnosis not present

## 2013-08-24 DIAGNOSIS — M542 Cervicalgia: Secondary | ICD-10-CM | POA: Diagnosis not present

## 2013-08-24 DIAGNOSIS — R5383 Other fatigue: Secondary | ICD-10-CM | POA: Diagnosis not present

## 2013-08-24 DIAGNOSIS — IMO0001 Reserved for inherently not codable concepts without codable children: Secondary | ICD-10-CM | POA: Diagnosis not present

## 2013-08-24 DIAGNOSIS — M7989 Other specified soft tissue disorders: Secondary | ICD-10-CM | POA: Diagnosis not present

## 2013-08-24 DIAGNOSIS — M2550 Pain in unspecified joint: Secondary | ICD-10-CM | POA: Diagnosis not present

## 2013-08-24 DIAGNOSIS — G4700 Insomnia, unspecified: Secondary | ICD-10-CM | POA: Diagnosis not present

## 2013-08-24 DIAGNOSIS — M25639 Stiffness of unspecified wrist, not elsewhere classified: Secondary | ICD-10-CM | POA: Diagnosis not present

## 2013-08-30 DIAGNOSIS — M25639 Stiffness of unspecified wrist, not elsewhere classified: Secondary | ICD-10-CM | POA: Diagnosis not present

## 2013-08-30 DIAGNOSIS — M6281 Muscle weakness (generalized): Secondary | ICD-10-CM | POA: Diagnosis not present

## 2013-08-30 DIAGNOSIS — M2550 Pain in unspecified joint: Secondary | ICD-10-CM | POA: Diagnosis not present

## 2013-08-30 DIAGNOSIS — M7989 Other specified soft tissue disorders: Secondary | ICD-10-CM | POA: Diagnosis not present

## 2013-09-13 IMAGING — MG MM DIGITAL DIAGNOSTIC BILAT
5 series · 5 of 5 positions shown · non-contrast
Comparison: 07/10/2011, 07/07/2010, dating back to 07/01/2006.

CLINICAL DATA: Shooting pains and heaviness in both breasts.  The
patient recently discontinued hormonal cream therapy.  Recent
diagnosis of breast cancer in a sister.

DIGITAL DIAGNOSTIC BILATERAL MAMMOGRAM WITH CAD

[R CC]
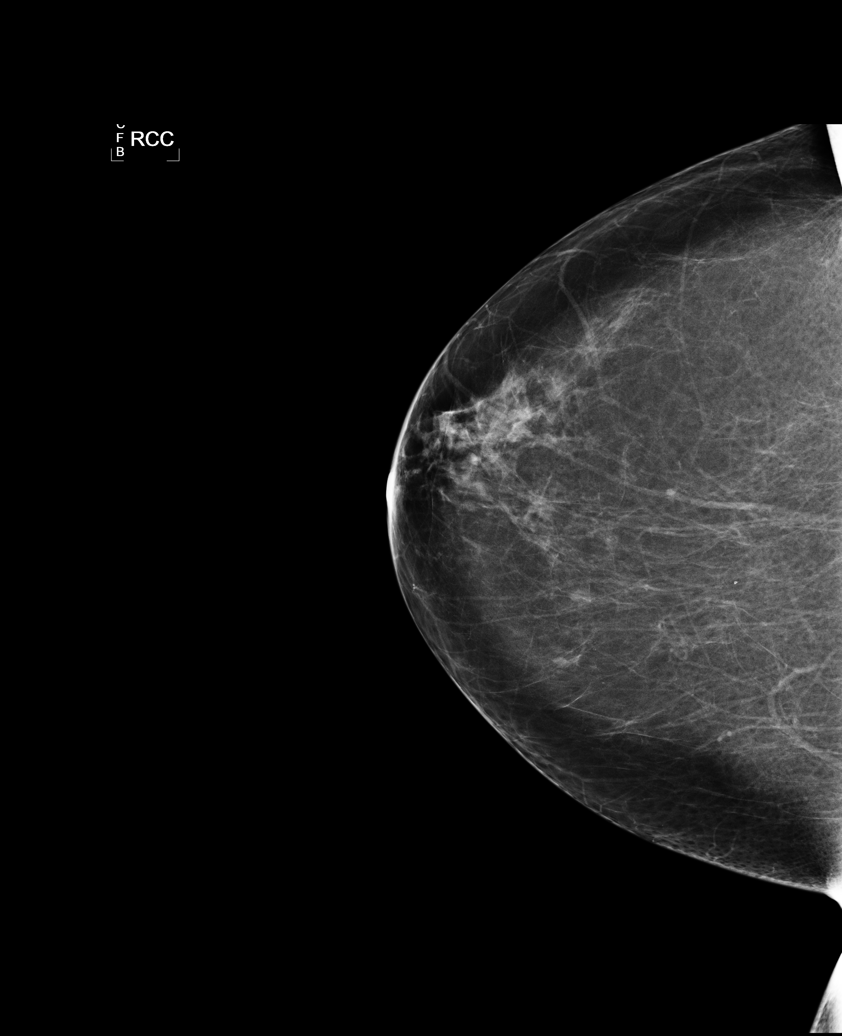

[L CC]
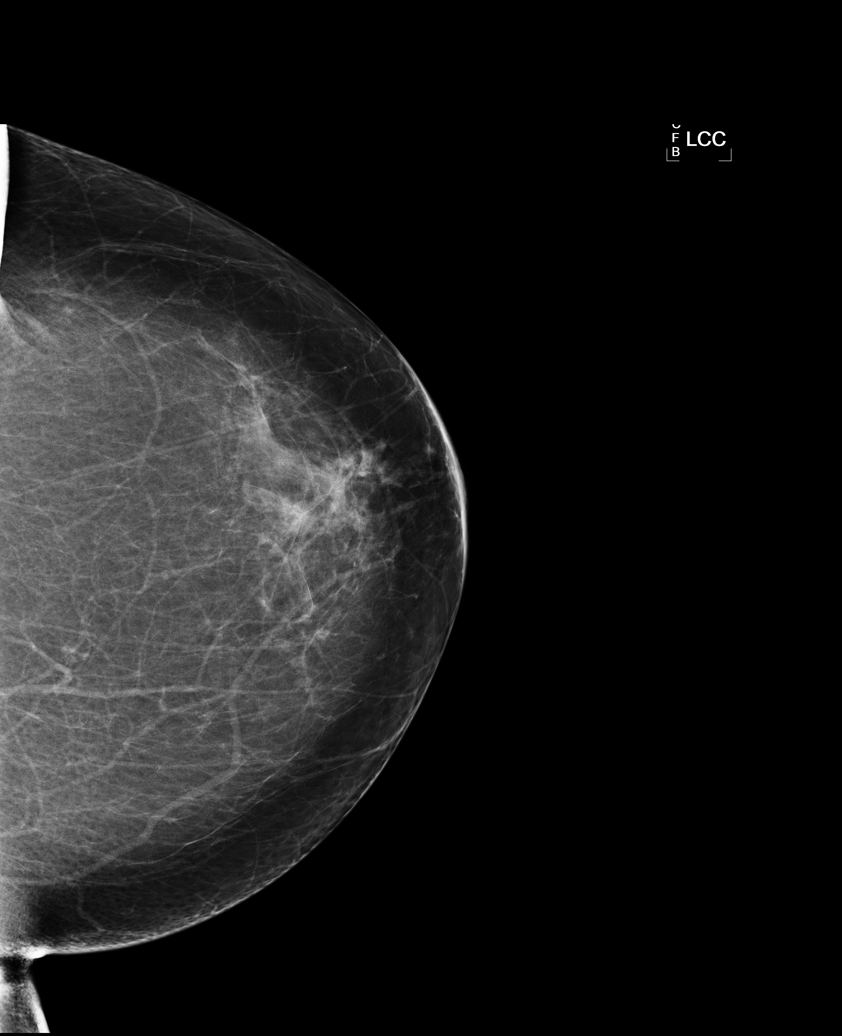

[L MLO (1 of 2)]
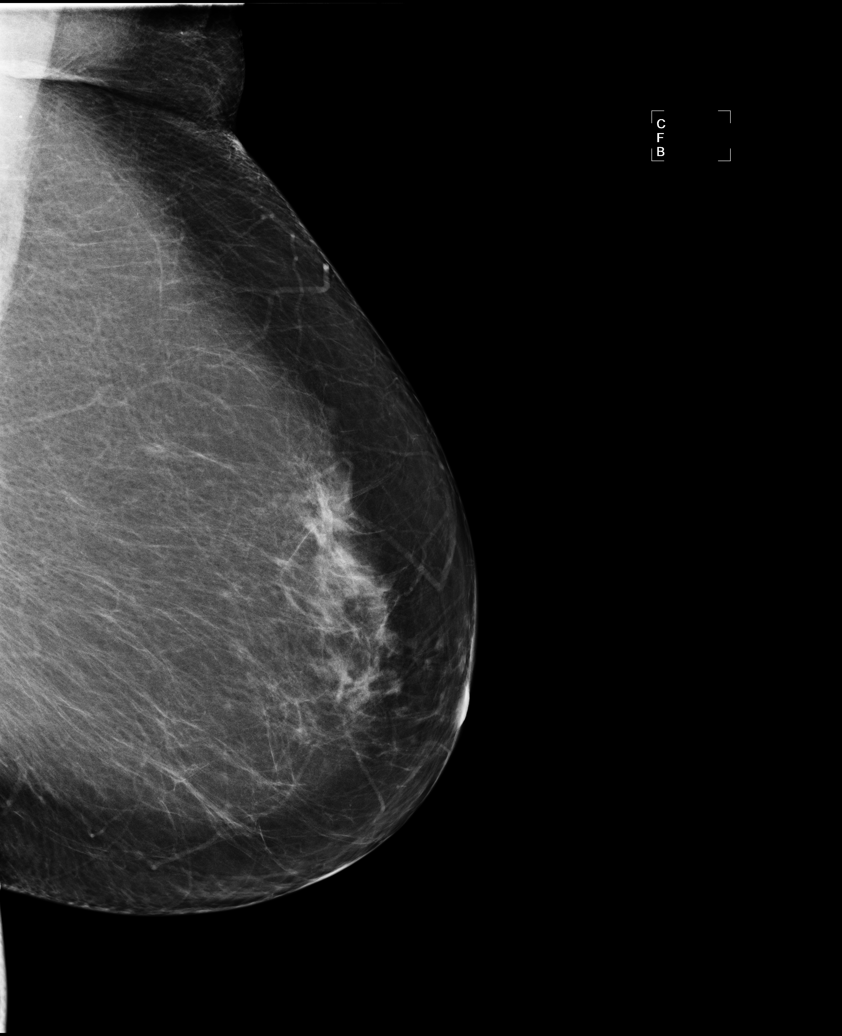

[R MLO]
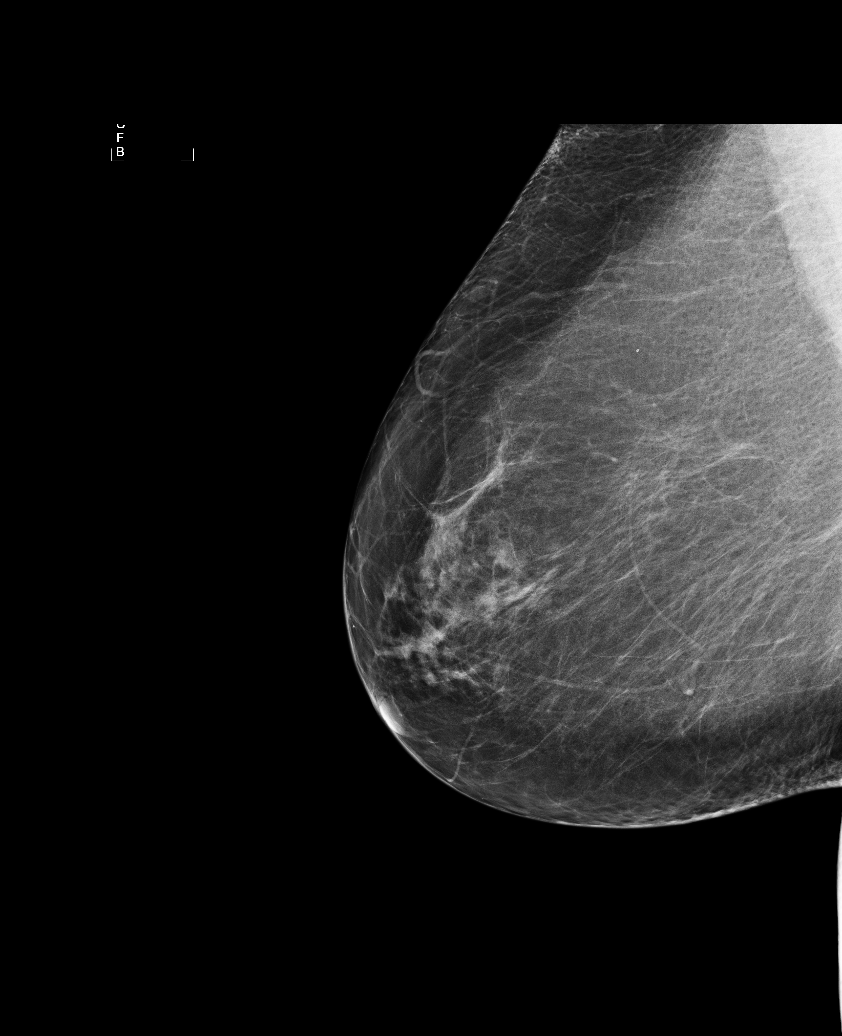

[L MLO (2 of 2)]
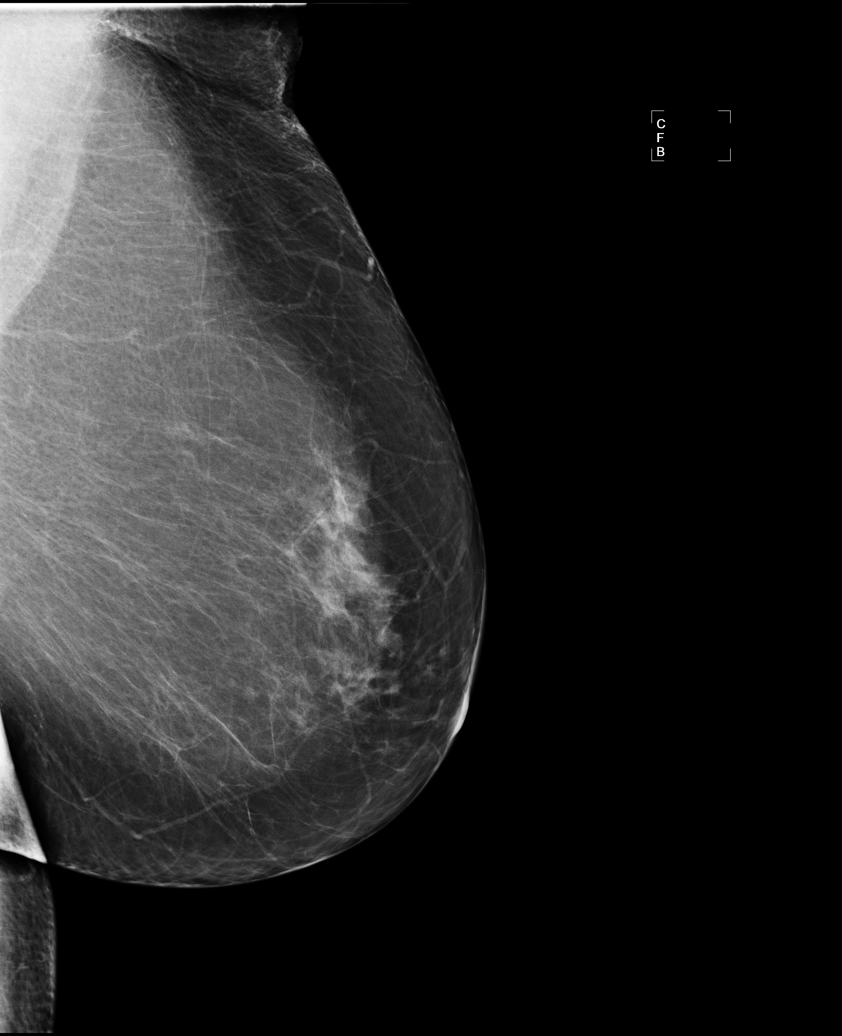

[5 of 5 positions shown; findings below may reference images not displayed]

FINDINGS: CC and MLO views of both breasts were obtained.
Scattered fibroglandular breast tissue, unchanged.  No new
suspicious mass, nonsurgical architectural distortion, or
suspicious calcifications in either breast.
Mammographic images were processed with CAD.
IMPRESSION: No specific mammographic evidence of malignancy.

RECOMMENDATION:
Screening mammogram in one year. (Code:BI-B-LYP)

The patient was encouraged to perform monthly self breast
examination and communicate any changes with her primary physician.
I have discussed the findings and recommendations with the patient.
Results were also provided in writing at the conclusion of the
visit.

BI-RADS CATEGORY 1:  Negative.

## 2013-09-14 DIAGNOSIS — M2550 Pain in unspecified joint: Secondary | ICD-10-CM | POA: Diagnosis not present

## 2013-09-14 DIAGNOSIS — M25639 Stiffness of unspecified wrist, not elsewhere classified: Secondary | ICD-10-CM | POA: Diagnosis not present

## 2013-09-14 DIAGNOSIS — M6281 Muscle weakness (generalized): Secondary | ICD-10-CM | POA: Diagnosis not present

## 2013-09-14 DIAGNOSIS — M7989 Other specified soft tissue disorders: Secondary | ICD-10-CM | POA: Diagnosis not present

## 2013-09-18 DIAGNOSIS — S52599A Other fractures of lower end of unspecified radius, initial encounter for closed fracture: Secondary | ICD-10-CM | POA: Diagnosis not present

## 2013-09-22 DIAGNOSIS — R079 Chest pain, unspecified: Secondary | ICD-10-CM | POA: Diagnosis not present

## 2013-09-22 DIAGNOSIS — E7800 Pure hypercholesterolemia, unspecified: Secondary | ICD-10-CM | POA: Diagnosis not present

## 2013-09-22 DIAGNOSIS — R439 Unspecified disturbances of smell and taste: Secondary | ICD-10-CM | POA: Diagnosis not present

## 2013-10-09 DIAGNOSIS — R439 Unspecified disturbances of smell and taste: Secondary | ICD-10-CM | POA: Diagnosis not present

## 2013-10-09 DIAGNOSIS — R079 Chest pain, unspecified: Secondary | ICD-10-CM | POA: Diagnosis not present

## 2013-10-09 DIAGNOSIS — K219 Gastro-esophageal reflux disease without esophagitis: Secondary | ICD-10-CM | POA: Diagnosis not present

## 2013-10-26 DIAGNOSIS — H2510 Age-related nuclear cataract, unspecified eye: Secondary | ICD-10-CM | POA: Diagnosis not present

## 2013-10-26 DIAGNOSIS — H35359 Cystoid macular degeneration, unspecified eye: Secondary | ICD-10-CM | POA: Diagnosis not present

## 2013-10-31 DIAGNOSIS — H04129 Dry eye syndrome of unspecified lacrimal gland: Secondary | ICD-10-CM | POA: Diagnosis not present

## 2013-10-31 DIAGNOSIS — H1045 Other chronic allergic conjunctivitis: Secondary | ICD-10-CM | POA: Diagnosis not present

## 2013-10-31 DIAGNOSIS — H16109 Unspecified superficial keratitis, unspecified eye: Secondary | ICD-10-CM | POA: Diagnosis not present

## 2013-11-01 DIAGNOSIS — N3010 Interstitial cystitis (chronic) without hematuria: Secondary | ICD-10-CM | POA: Diagnosis not present

## 2013-11-03 DIAGNOSIS — H35379 Puckering of macula, unspecified eye: Secondary | ICD-10-CM | POA: Diagnosis not present

## 2013-11-13 DIAGNOSIS — H35359 Cystoid macular degeneration, unspecified eye: Secondary | ICD-10-CM | POA: Diagnosis not present

## 2013-12-04 ENCOUNTER — Other Ambulatory Visit: Payer: Self-pay | Admitting: Gynecology

## 2013-12-04 DIAGNOSIS — M8580 Other specified disorders of bone density and structure, unspecified site: Secondary | ICD-10-CM

## 2013-12-11 DIAGNOSIS — H16109 Unspecified superficial keratitis, unspecified eye: Secondary | ICD-10-CM | POA: Diagnosis not present

## 2013-12-11 DIAGNOSIS — H3581 Retinal edema: Secondary | ICD-10-CM | POA: Diagnosis not present

## 2014-01-02 ENCOUNTER — Ambulatory Visit (INDEPENDENT_AMBULATORY_CARE_PROVIDER_SITE_OTHER): Payer: Medicare Other

## 2014-01-02 ENCOUNTER — Encounter: Payer: Self-pay | Admitting: Gynecology

## 2014-01-02 ENCOUNTER — Ambulatory Visit (INDEPENDENT_AMBULATORY_CARE_PROVIDER_SITE_OTHER): Payer: Medicare Other | Admitting: Gynecology

## 2014-01-02 DIAGNOSIS — M899 Disorder of bone, unspecified: Secondary | ICD-10-CM

## 2014-01-02 DIAGNOSIS — N760 Acute vaginitis: Secondary | ICD-10-CM

## 2014-01-02 DIAGNOSIS — N949 Unspecified condition associated with female genital organs and menstrual cycle: Secondary | ICD-10-CM | POA: Diagnosis not present

## 2014-01-02 DIAGNOSIS — N898 Other specified noninflammatory disorders of vagina: Secondary | ICD-10-CM

## 2014-01-02 DIAGNOSIS — A499 Bacterial infection, unspecified: Secondary | ICD-10-CM

## 2014-01-02 DIAGNOSIS — M949 Disorder of cartilage, unspecified: Secondary | ICD-10-CM

## 2014-01-02 DIAGNOSIS — B9689 Other specified bacterial agents as the cause of diseases classified elsewhere: Secondary | ICD-10-CM | POA: Diagnosis not present

## 2014-01-02 DIAGNOSIS — M8580 Other specified disorders of bone density and structure, unspecified site: Secondary | ICD-10-CM

## 2014-01-02 LAB — WET PREP FOR TRICH, YEAST, CLUE
CLUE CELLS WET PREP: NONE SEEN
Trich, Wet Prep: NONE SEEN
Yeast Wet Prep HPF POC: NONE SEEN

## 2014-01-02 MED ORDER — METRONIDAZOLE 500 MG PO TABS: 500.0000 mg | ORAL_TABLET | Freq: Two times a day (BID) | ORAL | Status: DC

## 2014-01-02 NOTE — Patient Instructions (Signed)
Take Flagyl medication twice daily for 7 days. Avoid alcohol while taking. Follow up if symptoms persist, worsen or recur.

## 2014-01-02 NOTE — Progress Notes (Signed)
Rebecca Newton 28-Dec-1945 [id number]        67 y.o.  G1P1001 presents today for her bone density but also had noticed some vaginal odor. No real discharge. Is being treated for lichen sclerosis using Temovate cream intermittently.  Past medical history,surgical history, problem list, medications, allergies, family history and social history were all reviewed and documented in the EPIC chart.  Directed ROS with pertinent positives and negatives documented in the history of present illness/assessment and plan.  Exam: Kimassistant General appearance  Normal  external BUS vagina with atrophic changes. Some loss of pigment suggested of lichen sclerosus bilateral labia  minora and majora. Bimanual without masses or tenderness.   Assessment/Plan:  68 y.o. G1P1001  exam and wet prep consistent with early bacterial vaginosis. Treat with Flagyl 500 mg twice a day x7 days at her choice. Alcohol avoidance reviewed. Followup if symptoms persist, worsen or recur. Followup for bone density results.   Note: This document was prepared with digital dictation and possible smart phrase technology. Any transcriptional errors that result from this process are unintentional.   Anastasio Auerbach MD, 1:48 PM 01/02/2014

## 2014-01-03 ENCOUNTER — Telehealth: Payer: Self-pay | Admitting: Gynecology

## 2014-01-03 ENCOUNTER — Encounter: Payer: Self-pay | Admitting: Gynecology

## 2014-01-03 NOTE — Telephone Encounter (Signed)
Left message for pt to call.

## 2014-01-03 NOTE — Telephone Encounter (Signed)
Pt informed, will call back to schedule consult visit.

## 2014-01-03 NOTE — Telephone Encounter (Signed)
Tell patient bone density shows increased fracture risk. I would like her to make an appointment so we can talk about treatment options and whether we want to treat it all at this point.

## 2014-01-08 DIAGNOSIS — H35359 Cystoid macular degeneration, unspecified eye: Secondary | ICD-10-CM | POA: Diagnosis not present

## 2014-01-08 DIAGNOSIS — H35379 Puckering of macula, unspecified eye: Secondary | ICD-10-CM | POA: Diagnosis not present

## 2014-02-13 DIAGNOSIS — K219 Gastro-esophageal reflux disease without esophagitis: Secondary | ICD-10-CM | POA: Diagnosis not present

## 2014-02-13 DIAGNOSIS — F411 Generalized anxiety disorder: Secondary | ICD-10-CM | POA: Diagnosis not present

## 2014-02-13 DIAGNOSIS — G4700 Insomnia, unspecified: Secondary | ICD-10-CM | POA: Diagnosis not present

## 2014-02-13 DIAGNOSIS — IMO0001 Reserved for inherently not codable concepts without codable children: Secondary | ICD-10-CM | POA: Diagnosis not present

## 2014-02-19 ENCOUNTER — Ambulatory Visit: Payer: Medicare Other | Admitting: Gynecology

## 2014-02-26 ENCOUNTER — Encounter: Payer: Self-pay | Admitting: Gynecology

## 2014-02-26 ENCOUNTER — Ambulatory Visit (INDEPENDENT_AMBULATORY_CARE_PROVIDER_SITE_OTHER): Payer: Medicare Other | Admitting: Gynecology

## 2014-02-26 DIAGNOSIS — M899 Disorder of bone, unspecified: Secondary | ICD-10-CM

## 2014-02-26 DIAGNOSIS — M949 Disorder of cartilage, unspecified: Secondary | ICD-10-CM | POA: Diagnosis not present

## 2014-02-26 DIAGNOSIS — M8580 Other specified disorders of bone density and structure, unspecified site: Secondary | ICD-10-CM

## 2014-02-26 NOTE — Progress Notes (Signed)
Rebecca Newton 1945-10-12 [id number]        67 y.o.  G1P1001 presents to discuss her DEXA 12/2013 T score -1.3 FRAX 25%/2.6%. Patient has a history of osteopenia in the past with trial of bisphosphonates. She was intolerant with GI upset. Had other alternatives reviewed with her by Dr. Cherylann Banas and ultimately elected not to do anything. Had also been on vitamin D 50,000 units weekly although has no history of any recent vitamin D levels.  Past medical history,surgical history, problem list, medications, allergies, family history and social history were all reviewed and documented in the EPIC chart.  Directed ROS with pertinent positives and negatives documented in the history of present illness/assessment and plan.   Assessment/Plan:  68 y.o. G1P1001 with osteopenia with elevated overall ten-year fracture risk at 25%. I reviewed the issues of whether to treat or not treat. Her values are stable from her prior DEXA 2013 without any decline. She also relates that her positive fracture history of her ankle was due to a traumatic fall and was not what she would consider a fragility low-impact fracture. I reviewed treatment options to include bisphosphate, Evista and Prolia. Risks/benefits reviewed. Ultimately the patient elects not to treat understanding a slight increased ten-year fracture risk. We will go ahead and check her vitamin D level and treat this aggressively if low. Plan repeat DEXA in 2 years and then go from there.   Note: This document was prepared with digital dictation and possible smart phrase technology. Any transcriptional errors that result from this process are unintentional.   Anastasio Auerbach MD, 3:30 PM 02/26/2014

## 2014-02-26 NOTE — Patient Instructions (Signed)
Followup for your vitamin D level, otherwise followup for your annual exam when due.

## 2014-02-27 ENCOUNTER — Ambulatory Visit: Payer: Medicare Other | Admitting: Gynecology

## 2014-02-27 LAB — VITAMIN D 25 HYDROXY (VIT D DEFICIENCY, FRACTURES): Vit D, 25-Hydroxy: 18 ng/mL — ABNORMAL LOW (ref 30–89)

## 2014-02-28 ENCOUNTER — Other Ambulatory Visit: Payer: Self-pay | Admitting: Gynecology

## 2014-02-28 DIAGNOSIS — E559 Vitamin D deficiency, unspecified: Secondary | ICD-10-CM

## 2014-02-28 MED ORDER — ERGOCALCIFEROL 1.25 MG (50000 UT) PO CAPS: 50000.0000 [IU] | ORAL_CAPSULE | ORAL | Status: DC

## 2014-03-20 ENCOUNTER — Other Ambulatory Visit: Payer: Self-pay

## 2014-03-20 DIAGNOSIS — Z1231 Encounter for screening mammogram for malignant neoplasm of breast: Secondary | ICD-10-CM

## 2014-03-23 DIAGNOSIS — G4700 Insomnia, unspecified: Secondary | ICD-10-CM | POA: Diagnosis not present

## 2014-03-23 DIAGNOSIS — R5381 Other malaise: Secondary | ICD-10-CM | POA: Diagnosis not present

## 2014-03-23 DIAGNOSIS — M542 Cervicalgia: Secondary | ICD-10-CM | POA: Diagnosis not present

## 2014-03-23 DIAGNOSIS — IMO0001 Reserved for inherently not codable concepts without codable children: Secondary | ICD-10-CM | POA: Diagnosis not present

## 2014-04-04 DIAGNOSIS — H35359 Cystoid macular degeneration, unspecified eye: Secondary | ICD-10-CM | POA: Diagnosis not present

## 2014-04-04 DIAGNOSIS — H43829 Vitreomacular adhesion, unspecified eye: Secondary | ICD-10-CM | POA: Diagnosis not present

## 2014-04-04 DIAGNOSIS — H35379 Puckering of macula, unspecified eye: Secondary | ICD-10-CM | POA: Diagnosis not present

## 2014-04-04 DIAGNOSIS — H04129 Dry eye syndrome of unspecified lacrimal gland: Secondary | ICD-10-CM | POA: Diagnosis not present

## 2014-04-30 DIAGNOSIS — M797 Fibromyalgia: Secondary | ICD-10-CM | POA: Diagnosis not present

## 2014-04-30 DIAGNOSIS — Z23 Encounter for immunization: Secondary | ICD-10-CM | POA: Diagnosis not present

## 2014-04-30 DIAGNOSIS — N3010 Interstitial cystitis (chronic) without hematuria: Secondary | ICD-10-CM | POA: Diagnosis not present

## 2014-05-04 ENCOUNTER — Telehealth: Payer: Self-pay

## 2014-05-04 NOTE — Telephone Encounter (Signed)
I agree with checking her vitamin D level

## 2014-05-04 NOTE — Telephone Encounter (Signed)
Patient advised.

## 2014-05-04 NOTE — Telephone Encounter (Signed)
Patient has been on Vit D 50000 units weekly and will be coming soon to have her Vit D level checked to see how her level is now.  She said she had appt with Dr. Patrecia Pour (sp?) and he told her to ask you if you would be willing to keep her on the 50,000 units every week versus her having to take a daily supplement because "there would be less stomach upset if I just take it once a week". She said she has issues with reflux.    I recommended that she come and have her Vit D level checked and you would know more then if that would even be appropriate. I told her that there is a big difference between 50,000 weekly and 1000-2000 daily and she may not need that much extra Vit D and it would not be appropriate.  She asked me to run it by you.

## 2014-05-23 ENCOUNTER — Other Ambulatory Visit: Payer: Medicare Other

## 2014-05-23 DIAGNOSIS — N3010 Interstitial cystitis (chronic) without hematuria: Secondary | ICD-10-CM | POA: Diagnosis not present

## 2014-05-23 DIAGNOSIS — E559 Vitamin D deficiency, unspecified: Secondary | ICD-10-CM

## 2014-05-23 DIAGNOSIS — M791 Myalgia: Secondary | ICD-10-CM | POA: Diagnosis not present

## 2014-05-27 LAB — VITAMIN D 1,25 DIHYDROXY
VITAMIN D3 1, 25 (OH): 10 pg/mL
Vitamin D 1, 25 (OH)2 Total: 88 pg/mL — ABNORMAL HIGH (ref 18–72)
Vitamin D2 1, 25 (OH)2: 78 pg/mL

## 2014-05-28 ENCOUNTER — Encounter: Payer: Self-pay | Admitting: Gynecology

## 2014-05-28 ENCOUNTER — Other Ambulatory Visit: Payer: Self-pay | Admitting: Gynecology

## 2014-05-28 DIAGNOSIS — E559 Vitamin D deficiency, unspecified: Secondary | ICD-10-CM

## 2014-05-28 MED ORDER — VITAMIN D (ERGOCALCIFEROL) 1.25 MG (50000 UNIT) PO CAPS: ORAL_CAPSULE | ORAL | Status: DC

## 2014-05-30 ENCOUNTER — Other Ambulatory Visit: Payer: Medicare Other

## 2014-05-30 ENCOUNTER — Encounter: Payer: Self-pay | Admitting: Gynecology

## 2014-05-30 ENCOUNTER — Ambulatory Visit
Admission: RE | Admit: 2014-05-30 | Discharge: 2014-05-30 | Disposition: A | Discharge status: Home / Self Care | Payer: Medicare Other | Source: Ambulatory Visit

## 2014-05-30 ENCOUNTER — Ambulatory Visit (INDEPENDENT_AMBULATORY_CARE_PROVIDER_SITE_OTHER): Payer: Medicare Other | Admitting: Gynecology

## 2014-05-30 DIAGNOSIS — G4700 Insomnia, unspecified: Secondary | ICD-10-CM | POA: Diagnosis not present

## 2014-05-30 DIAGNOSIS — Z1231 Encounter for screening mammogram for malignant neoplasm of breast: Secondary | ICD-10-CM

## 2014-05-30 MED ORDER — DIAZEPAM 10 MG PO TABS: 10.0000 mg | ORAL_TABLET | Freq: Four times a day (QID) | ORAL | Status: DC | PRN

## 2014-05-30 NOTE — Patient Instructions (Signed)
Follow up in January 2016 when due for your annual exam

## 2014-05-30 NOTE — Progress Notes (Signed)
Rebecca Newton 01/22/1946 [id number]        67 y.o.  G1P1001 Presents to discuss her Valium prescription. She uses it both for sleep aid as well as occasional anxiety during the day. Apparently she is having some issues with reimbursement from her insurance. She is doing well using the 10 mg intermittently.  Past medical history,surgical history, problem list, medications, allergies, family history and social history were all reviewed and documented in the EPIC chart.  Directed ROS with pertinent positives and negatives documented in the history of present illness/assessment and plan.   Assessment/Plan:  68 y.o. G1P1001 intermittent anxiety with insomnia. Uses Valium 10 mg with good results. Not having significant side effects or sedation. Is concerned about insurance reimbursement. I wrote her a prescription for Valium 10 mg #60 no refill to get her through the remainder of the year and then she is due to see me in January for her follow up exam and we will go from there.     Anastasio Auerbach MD, 10:05 AM 05/30/2014

## 2014-05-31 DIAGNOSIS — M797 Fibromyalgia: Secondary | ICD-10-CM | POA: Diagnosis not present

## 2014-05-31 DIAGNOSIS — N3010 Interstitial cystitis (chronic) without hematuria: Secondary | ICD-10-CM | POA: Diagnosis not present

## 2014-05-31 DIAGNOSIS — K580 Irritable bowel syndrome with diarrhea: Secondary | ICD-10-CM | POA: Diagnosis not present

## 2014-05-31 DIAGNOSIS — N94819 Vulvodynia, unspecified: Secondary | ICD-10-CM | POA: Diagnosis not present

## 2014-06-01 ENCOUNTER — Other Ambulatory Visit: Payer: Self-pay | Admitting: Internal Medicine

## 2014-06-01 DIAGNOSIS — R928 Other abnormal and inconclusive findings on diagnostic imaging of breast: Secondary | ICD-10-CM

## 2014-06-05 ENCOUNTER — Ambulatory Visit
Admission: RE | Admit: 2014-06-05 | Discharge: 2014-06-05 | Disposition: A | Discharge status: Home / Self Care | Payer: Medicare Other | Source: Ambulatory Visit | Attending: Internal Medicine | Admitting: Internal Medicine

## 2014-06-05 ENCOUNTER — Telehealth: Payer: Self-pay | Admitting: *Deleted

## 2014-06-05 DIAGNOSIS — N6001 Solitary cyst of right breast: Secondary | ICD-10-CM | POA: Diagnosis not present

## 2014-06-05 DIAGNOSIS — R928 Other abnormal and inconclusive findings on diagnostic imaging of breast: Secondary | ICD-10-CM

## 2014-06-05 NOTE — Telephone Encounter (Signed)
I returned patient's phone call and she actually had just left the radiology department where she had arranged a follow up ultrasound. She was told verbally that the area of concern were cysts and there was nothing further needs to be done but to repeat the mammogram in one year.

## 2014-06-05 NOTE — Telephone Encounter (Signed)
Pt called crying at front desk very upset about additional views needed for her recent mammogram, asked if you could gave her some answers about mammogram report. Pt is schedule to return to breast center on 06/07/14 for additional views and very nervous would like some feed back and call her if possible at [phone]. Please advise

## 2014-06-07 ENCOUNTER — Other Ambulatory Visit: Payer: Medicare Other

## 2014-06-07 ENCOUNTER — Ambulatory Visit: Payer: Medicare Other | Admitting: Gynecology

## 2014-06-12 DIAGNOSIS — F413 Other mixed anxiety disorders: Secondary | ICD-10-CM | POA: Diagnosis not present

## 2014-06-12 DIAGNOSIS — Z0001 Encounter for general adult medical examination with abnormal findings: Secondary | ICD-10-CM | POA: Diagnosis not present

## 2014-06-12 DIAGNOSIS — G4700 Insomnia, unspecified: Secondary | ICD-10-CM | POA: Diagnosis not present

## 2014-06-12 DIAGNOSIS — N3010 Interstitial cystitis (chronic) without hematuria: Secondary | ICD-10-CM | POA: Diagnosis not present

## 2014-06-12 DIAGNOSIS — Z1389 Encounter for screening for other disorder: Secondary | ICD-10-CM | POA: Diagnosis not present

## 2014-07-09 DIAGNOSIS — H35371 Puckering of macula, right eye: Secondary | ICD-10-CM | POA: Diagnosis not present

## 2014-07-12 DIAGNOSIS — H2513 Age-related nuclear cataract, bilateral: Secondary | ICD-10-CM | POA: Diagnosis not present

## 2014-07-12 DIAGNOSIS — H5315 Visual distortions of shape and size: Secondary | ICD-10-CM | POA: Diagnosis not present

## 2014-07-12 DIAGNOSIS — H01022 Squamous blepharitis right lower eyelid: Secondary | ICD-10-CM | POA: Diagnosis not present

## 2014-07-12 DIAGNOSIS — H01025 Squamous blepharitis left lower eyelid: Secondary | ICD-10-CM | POA: Diagnosis not present

## 2014-07-12 DIAGNOSIS — H43821 Vitreomacular adhesion, right eye: Secondary | ICD-10-CM | POA: Diagnosis not present

## 2014-07-12 DIAGNOSIS — H01024 Squamous blepharitis left upper eyelid: Secondary | ICD-10-CM | POA: Diagnosis not present

## 2014-07-12 DIAGNOSIS — H01021 Squamous blepharitis right upper eyelid: Secondary | ICD-10-CM | POA: Diagnosis not present

## 2014-08-02 ENCOUNTER — Other Ambulatory Visit (HOSPITAL_COMMUNITY)
Admission: RE | Admit: 2014-08-02 | Discharge: 2014-08-02 | Disposition: A | Discharge status: Home / Self Care | Payer: Medicare Other | Source: Ambulatory Visit | Attending: Gynecology | Admitting: Gynecology

## 2014-08-02 ENCOUNTER — Ambulatory Visit (INDEPENDENT_AMBULATORY_CARE_PROVIDER_SITE_OTHER): Payer: Medicare Other | Admitting: Gynecology

## 2014-08-02 ENCOUNTER — Encounter: Payer: Self-pay | Admitting: Gynecology

## 2014-08-02 VITALS — BP 130/78 | Ht 62.0 in | Wt 165.0 lb

## 2014-08-02 DIAGNOSIS — N952 Postmenopausal atrophic vaginitis: Secondary | ICD-10-CM | POA: Diagnosis not present

## 2014-08-02 DIAGNOSIS — Z01419 Encounter for gynecological examination (general) (routine) without abnormal findings: Secondary | ICD-10-CM

## 2014-08-02 DIAGNOSIS — M8580 Other specified disorders of bone density and structure, unspecified site: Secondary | ICD-10-CM

## 2014-08-02 DIAGNOSIS — Z1272 Encounter for screening for malignant neoplasm of vagina: Secondary | ICD-10-CM

## 2014-08-02 DIAGNOSIS — Z124 Encounter for screening for malignant neoplasm of cervix: Secondary | ICD-10-CM | POA: Diagnosis not present

## 2014-08-02 MED ORDER — DIAZEPAM 10 MG PO TABS: 10.0000 mg | ORAL_TABLET | Freq: Four times a day (QID) | ORAL | Status: DC | PRN

## 2014-08-02 NOTE — Addendum Note (Signed)
Addended by: Nelva Nay on: 08/02/2014 03:09 PM   Modules accepted: Orders, SmartSet

## 2014-08-02 NOTE — Progress Notes (Signed)
Rebecca Newton [birth year] [id number]        69 y.o.  G1P1001 for breast and pelvic exam. Several issues noted below. Past medical history,surgical history, problem list, medications, allergies, family history and social history were all reviewed and documented as reviewed in the EPIC chart.  ROS:  Performed with pertinent positives and negatives included in the history, assessment and plan.   Additional significant findings :  none   Exam: Kim Counsellor Vitals:   08/02/14 1402  BP: 130/78  Height: 5\' 2"  (1.575 m)  Weight: 165 lb (74.844 kg)   General appearance:  Normal affect, orientation and appearance. Skin: Grossly normal HEENT: Without gross lesions.  No cervical or supraclavicular adenopathy. Thyroid normal.  Lungs:  Clear without wheezing, rales or rhonchi Cardiac: RR, without RMG Abdominal:  Soft, nontender, without masses, guarding, rebound, organomegaly or hernia Breasts:  Examined lying and sitting without masses, retractions, discharge or axillary adenopathy. Pelvic:  Ext/BUS/vagina with generalized atrophic changes. Pap of cuff done  Adnexa  Without masses or tenderness    Anus and perineum  Normal   Rectovaginal  Normal sphincter tone without palpated masses or tenderness.    Assessment/Plan:  [name] female for annual exam.   1. Postmenopausal/atrophic genital changes. Status post LAVH BSO 2007 for adenomyosis and leiomyoma. Is without significant symptoms of hot flashes, night sweats, vaginal dryness or dyspareunia. 2. Pap smear 2013. Pap smear done today at her request. History of cryosurgery greater than 30 years ago with normal Pap smears since then. Status post hysterectomy for benign indications. Over the age of 51. Options to stop screening reviewed the patient's uncomfortable with this and requests continued screening. 3. Osteopenia. DEXA 12/2013 T score -1.3. FRAX 25%/2.6%. C83 2015 office note for discussion. Patient is not interested  in treatment prefers observation with repeat DEXA next year. Increased calcium vitamin D reviewed. 4. Mammography 05/2014. Continue with annual mammography. SBE monthly reviewed. 5. Colonoscopy 2012. Repeat at their recommended interval.  6. Valium use. Patient uses 10 mg Valium to help with sleeping and has done so for years. We have discussed this  Previously per 05/30/2014 note. She does not have issues with oversedation. Valium 10 mg #60 with 4 refills provided. 7. Health maintenance. No routine blood work done as this done at her primary physician's office. Follow up in one year, sooner as needed.     Anastasio Auerbach MD, 2:33 PM 08/02/2014

## 2014-08-02 NOTE — Patient Instructions (Signed)
You may obtain a copy of any labs that were done today by logging onto MyChart as outlined in the instructions provided with your AVS (after visit summary). The office will not call with normal lab results but certainly if there are any significant abnormalities then we will contact you.   Health Maintenance, Female A healthy lifestyle and preventative care can promote health and wellness.  Maintain regular health, dental, and eye exams.  Eat a healthy diet. Foods like vegetables, fruits, whole grains, low-fat dairy products, and lean protein foods contain the nutrients you need without too many calories. Decrease your intake of foods high in solid fats, added sugars, and salt. Get information about a proper diet from your caregiver, if necessary.  Regular physical exercise is one of the most important things you can do for your health. Most adults should get at least 150 minutes of moderate-intensity exercise (any activity that increases your heart rate and causes you to sweat) each week. In addition, most adults need muscle-strengthening exercises on 2 or more days a week.   Maintain a healthy weight. The body mass index (BMI) is a screening tool to identify possible weight problems. It provides an estimate of body fat based on height and weight. Your caregiver can help determine your BMI, and can help you achieve or maintain a healthy weight. For adults 20 years and older:  A BMI below 18.5 is considered underweight.  A BMI of 18.5 to 24.9 is normal.  A BMI of 25 to 29.9 is considered overweight.  A BMI of 30 and above is considered obese.  Maintain normal blood lipids and cholesterol by exercising and minimizing your intake of saturated fat. Eat a balanced diet with plenty of fruits and vegetables. Blood tests for lipids and cholesterol should begin at age 61 and be repeated every 5 years. If your lipid or cholesterol levels are high, you are over 50, or you are a high risk for heart  disease, you may need your cholesterol levels checked more frequently.Ongoing high lipid and cholesterol levels should be treated with medicines if diet and exercise are not effective.  If you smoke, find out from your caregiver how to quit. If you do not use tobacco, do not start.  Lung cancer screening is recommended for adults aged 33 80 years who are at high risk for developing lung cancer because of a history of smoking. Yearly low-dose computed tomography (CT) is recommended for people who have at least a 30-pack-year history of smoking and are a current smoker or have quit within the past 15 years. A pack year of smoking is smoking an average of 1 pack of cigarettes a day for 1 year (for example: 1 pack a day for 30 years or 2 packs a day for 15 years). Yearly screening should continue until the smoker has stopped smoking for at least 15 years. Yearly screening should also be stopped for people who develop a health problem that would prevent them from having lung cancer treatment.  If you are pregnant, do not drink alcohol. If you are breastfeeding, be very cautious about drinking alcohol. If you are not pregnant and choose to drink alcohol, do not exceed 1 drink per day. One drink is considered to be 12 ounces (355 mL) of beer, 5 ounces (148 mL) of wine, or 1.5 ounces (44 mL) of liquor.  Avoid use of street drugs. Do not share needles with anyone. Ask for help if you need support or instructions about stopping  the use of drugs.  High blood pressure causes heart disease and increases the risk of stroke. Blood pressure should be checked at least every 1 to 2 years. Ongoing high blood pressure should be treated with medicines, if weight loss and exercise are not effective.  If you are 59 to 70 years old, ask your caregiver if you should take aspirin to prevent strokes.  Diabetes screening involves taking a blood sample to check your fasting blood sugar level. This should be done once every 3  years, after age 91, if you are within normal weight and without risk factors for diabetes. Testing should be considered at a younger age or be carried out more frequently if you are overweight and have at least 1 risk factor for diabetes.  Breast cancer screening is essential preventative care for women. You should practice "breast self-awareness." This means understanding the normal appearance and feel of your breasts and may include breast self-examination. Any changes detected, no matter how small, should be reported to a caregiver. Women in their 66s and 30s should have a clinical breast exam (CBE) by a caregiver as part of a regular health exam every 1 to 3 years. After age 101, women should have a CBE every year. Starting at age 100, women should consider having a mammogram (breast X-ray) every year. Women who have a family history of breast cancer should talk to their caregiver about genetic screening. Women at a high risk of breast cancer should talk to their caregiver about having an MRI and a mammogram every year.  Breast cancer gene (BRCA)-related cancer risk assessment is recommended for women who have family members with BRCA-related cancers. BRCA-related cancers include breast, ovarian, tubal, and peritoneal cancers. Having family members with these cancers may be associated with an increased risk for harmful changes (mutations) in the breast cancer genes BRCA1 and BRCA2. Results of the assessment will determine the need for genetic counseling and BRCA1 and BRCA2 testing.  The Pap test is a screening test for cervical cancer. Women should have a Pap test starting at age 57. Between ages 25 and 35, Pap tests should be repeated every 2 years. Beginning at age 37, you should have a Pap test every 3 years as long as the past 3 Pap tests have been normal. If you had a hysterectomy for a problem that was not cancer or a condition that could lead to cancer, then you no longer need Pap tests. If you are  between ages 50 and 76, and you have had normal Pap tests going back 10 years, you no longer need Pap tests. If you have had past treatment for cervical cancer or a condition that could lead to cancer, you need Pap tests and screening for cancer for at least 20 years after your treatment. If Pap tests have been discontinued, risk factors (such as a new sexual partner) need to be reassessed to determine if screening should be resumed. Some women have medical problems that increase the chance of getting cervical cancer. In these cases, your caregiver may recommend more frequent screening and Pap tests.  The human papillomavirus (HPV) test is an additional test that may be used for cervical cancer screening. The HPV test looks for the virus that can cause the cell changes on the cervix. The cells collected during the Pap test can be tested for HPV. The HPV test could be used to screen women aged 44 years and older, and should be used in women of any age  who have unclear Pap test results. After the age of 55, women should have HPV testing at the same frequency as a Pap test.  Colorectal cancer can be detected and often prevented. Most routine colorectal cancer screening begins at the age of 44 and continues through age 20. However, your caregiver may recommend screening at an earlier age if you have risk factors for colon cancer. On a yearly basis, your caregiver may provide home test kits to check for hidden blood in the stool. Use of a small camera at the end of a tube, to directly examine the colon (sigmoidoscopy or colonoscopy), can detect the earliest forms of colorectal cancer. Talk to your caregiver about this at age 86, when routine screening begins. Direct examination of the colon should be repeated every 5 to 10 years through age 13, unless early forms of pre-cancerous polyps or small growths are found.  Hepatitis C blood testing is recommended for all people born from [birth year] through 1966 and any  individual with known risks for hepatitis C.  Practice safe sex. Use condoms and avoid high-risk sexual practices to reduce the spread of sexually transmitted infections (STIs). Sexually active women aged 36 and younger should be checked for Chlamydia, which is a common sexually transmitted infection. Older women with new or multiple partners should also be tested for Chlamydia. Testing for other STIs is recommended if you are sexually active and at increased risk.  Osteoporosis is a disease in which the bones lose minerals and strength with aging. This can result in serious bone fractures. The risk of osteoporosis can be identified using a bone density scan. Women ages 20 and over and women at risk for fractures or osteoporosis should discuss screening with their caregivers. Ask your caregiver whether you should be taking a calcium supplement or vitamin D to reduce the rate of osteoporosis.  Menopause can be associated with physical symptoms and risks. Hormone replacement therapy is available to decrease symptoms and risks. You should talk to your caregiver about whether hormone replacement therapy is right for you.  Use sunscreen. Apply sunscreen liberally and repeatedly throughout the day. You should seek shade when your shadow is shorter than you. Protect yourself by wearing long sleeves, pants, a wide-brimmed hat, and sunglasses year round, whenever you are outdoors.  Notify your caregiver of new moles or changes in moles, especially if there is a change in shape or color. Also notify your caregiver if a mole is larger than the size of a pencil eraser.  Stay current with your immunizations. Document Released: 01/26/2011 Document Revised: 11/07/2012 Document Reviewed: 01/26/2011 [facility] Patient Information 2014 [employer].

## 2014-08-03 LAB — URINALYSIS W MICROSCOPIC + REFLEX CULTURE
Bacteria, UA: NONE SEEN
Bilirubin Urine: NEGATIVE
CASTS: NONE SEEN
Crystals: NONE SEEN
Glucose, UA: NEGATIVE mg/dL
Hgb urine dipstick: NEGATIVE
KETONES UR: NEGATIVE mg/dL
Nitrite: NEGATIVE
PH: 5 (ref 5.0–8.0)
PROTEIN: NEGATIVE mg/dL
SPECIFIC GRAVITY, URINE: 1.021 (ref 1.005–1.030)
UROBILINOGEN UA: 0.2 mg/dL (ref 0.0–1.0)

## 2014-08-05 LAB — URINE CULTURE

## 2014-08-06 ENCOUNTER — Other Ambulatory Visit: Payer: Self-pay | Admitting: Gynecology

## 2014-08-06 LAB — CYTOLOGY - PAP

## 2014-08-06 MED ORDER — SULFAMETHOXAZOLE-TRIMETHOPRIM 800-160 MG PO TABS: 1.0000 | ORAL_TABLET | Freq: Two times a day (BID) | ORAL | Status: DC

## 2014-09-10 DIAGNOSIS — Z79899 Other long term (current) drug therapy: Secondary | ICD-10-CM | POA: Diagnosis not present

## 2014-09-10 DIAGNOSIS — E782 Mixed hyperlipidemia: Secondary | ICD-10-CM | POA: Diagnosis not present

## 2014-09-14 DIAGNOSIS — E782 Mixed hyperlipidemia: Secondary | ICD-10-CM | POA: Diagnosis not present

## 2014-09-14 DIAGNOSIS — N3010 Interstitial cystitis (chronic) without hematuria: Secondary | ICD-10-CM | POA: Diagnosis not present

## 2014-09-14 DIAGNOSIS — F413 Other mixed anxiety disorders: Secondary | ICD-10-CM | POA: Diagnosis not present

## 2014-09-14 DIAGNOSIS — G4700 Insomnia, unspecified: Secondary | ICD-10-CM | POA: Diagnosis not present

## 2014-09-14 DIAGNOSIS — R7309 Other abnormal glucose: Secondary | ICD-10-CM | POA: Diagnosis not present

## 2014-09-20 DIAGNOSIS — M722 Plantar fascial fibromatosis: Secondary | ICD-10-CM | POA: Diagnosis not present

## 2014-09-20 DIAGNOSIS — M797 Fibromyalgia: Secondary | ICD-10-CM | POA: Diagnosis not present

## 2014-09-20 DIAGNOSIS — M542 Cervicalgia: Secondary | ICD-10-CM | POA: Diagnosis not present

## 2014-09-20 DIAGNOSIS — M5030 Other cervical disc degeneration, unspecified cervical region: Secondary | ICD-10-CM | POA: Diagnosis not present

## 2014-11-01 DIAGNOSIS — R350 Frequency of micturition: Secondary | ICD-10-CM | POA: Diagnosis not present

## 2014-11-01 DIAGNOSIS — N3010 Interstitial cystitis (chronic) without hematuria: Secondary | ICD-10-CM | POA: Diagnosis not present

## 2014-11-01 DIAGNOSIS — N94819 Vulvodynia, unspecified: Secondary | ICD-10-CM | POA: Diagnosis not present

## 2014-11-01 DIAGNOSIS — M797 Fibromyalgia: Secondary | ICD-10-CM | POA: Diagnosis not present

## 2014-11-01 DIAGNOSIS — R3915 Urgency of urination: Secondary | ICD-10-CM | POA: Diagnosis not present

## 2014-11-01 DIAGNOSIS — K580 Irritable bowel syndrome with diarrhea: Secondary | ICD-10-CM | POA: Diagnosis not present

## 2014-11-01 DIAGNOSIS — R5382 Chronic fatigue, unspecified: Secondary | ICD-10-CM | POA: Diagnosis not present

## 2014-11-30 DIAGNOSIS — E782 Mixed hyperlipidemia: Secondary | ICD-10-CM | POA: Diagnosis not present

## 2014-11-30 DIAGNOSIS — R7309 Other abnormal glucose: Secondary | ICD-10-CM | POA: Diagnosis not present

## 2015-01-08 DIAGNOSIS — J029 Acute pharyngitis, unspecified: Secondary | ICD-10-CM | POA: Diagnosis not present

## 2015-01-08 DIAGNOSIS — B373 Candidiasis of vulva and vagina: Secondary | ICD-10-CM | POA: Diagnosis not present

## 2015-01-28 ENCOUNTER — Other Ambulatory Visit: Payer: Self-pay | Admitting: Gynecology

## 2015-01-29 NOTE — Telephone Encounter (Signed)
Per result note on 05/23/14 "rechecking a vitamin D level in 6 months to see where we stand"

## 2015-02-11 ENCOUNTER — Other Ambulatory Visit: Payer: Medicare Other

## 2015-02-11 DIAGNOSIS — E559 Vitamin D deficiency, unspecified: Secondary | ICD-10-CM

## 2015-02-12 ENCOUNTER — Other Ambulatory Visit: Payer: Medicare Other

## 2015-02-12 DIAGNOSIS — H01022 Squamous blepharitis right lower eyelid: Secondary | ICD-10-CM | POA: Diagnosis not present

## 2015-02-12 DIAGNOSIS — H01024 Squamous blepharitis left upper eyelid: Secondary | ICD-10-CM | POA: Diagnosis not present

## 2015-02-12 DIAGNOSIS — H01021 Squamous blepharitis right upper eyelid: Secondary | ICD-10-CM | POA: Diagnosis not present

## 2015-02-12 DIAGNOSIS — H2513 Age-related nuclear cataract, bilateral: Secondary | ICD-10-CM | POA: Diagnosis not present

## 2015-02-12 DIAGNOSIS — H43821 Vitreomacular adhesion, right eye: Secondary | ICD-10-CM | POA: Diagnosis not present

## 2015-02-12 DIAGNOSIS — H01025 Squamous blepharitis left lower eyelid: Secondary | ICD-10-CM | POA: Diagnosis not present

## 2015-02-27 ENCOUNTER — Encounter: Payer: Self-pay | Admitting: Gynecology

## 2015-02-27 ENCOUNTER — Ambulatory Visit (INDEPENDENT_AMBULATORY_CARE_PROVIDER_SITE_OTHER): Payer: Medicare Other | Admitting: Gynecology

## 2015-02-27 VITALS — BP 120/76

## 2015-02-27 DIAGNOSIS — N898 Other specified noninflammatory disorders of vagina: Secondary | ICD-10-CM | POA: Diagnosis not present

## 2015-02-27 DIAGNOSIS — L900 Lichen sclerosus et atrophicus: Secondary | ICD-10-CM

## 2015-02-27 DIAGNOSIS — N899 Noninflammatory disorder of vagina, unspecified: Secondary | ICD-10-CM | POA: Diagnosis not present

## 2015-02-27 LAB — WET PREP FOR TRICH, YEAST, CLUE
CLUE CELLS WET PREP: NONE SEEN
TRICH WET PREP: NONE SEEN
Yeast Wet Prep HPF POC: NONE SEEN

## 2015-02-27 MED ORDER — CLOBETASOL PROPIONATE 0.05 % EX CREA: TOPICAL_CREAM | CUTANEOUS | Status: DC

## 2015-02-27 MED ORDER — FLUCONAZOLE 150 MG PO TABS: 150.0000 mg | ORAL_TABLET | Freq: Once | ORAL | Status: DC

## 2015-02-27 NOTE — Patient Instructions (Signed)
Take the Diflucan pill daily for 3 days. Apply the clobetasol cream twice daily for several days and then nightly for several weeks and then taper off. Use it as needed if any recurrent itching or irritation.

## 2015-02-27 NOTE — Progress Notes (Signed)
Steffanie Mingle Woodard-Hipps 29-Dec-1945 [id number]        69 y.o.  G1P1001 presents complaining of worsening vaginal itching and irritation of the last several weeks. She has one area that is particularly itchy. No significant discharge or odor. No palpable or visual abnormalities to self-exam. No urinary symptoms such as frequency dysuria or urgency.  Past medical history,surgical history, problem list, medications, allergies, family history and social history were all reviewed and documented in the EPIC chart.  Directed ROS with pertinent positives and negatives documented in the history of present illness/assessment and plan.  Exam: Kim assistant Filed Vitals:   02/27/15 1546  BP: 120/76   General appearance:  Normal Abdomen soft nontender without masses guarding rebound Pelvic external BUS vagina with atrophic changes. The area she's pointing to is at the left lower posterior fourchette. There are no lesions or skin changes other than atrophic changes in this area. Bimanual exam without masses or tenderness.  Assessment/Plan:  69 y.o. G1P1001 with both history. Exam consistent with atrophic changes. She does have a history of lichen sclerosus per Dr. Valeta Harms note biopsy-proven. Wet prep was negative. I am going to cover her with Diflucan given a past history of yeast infections with 150 mg daily 3 days. Temovate 0.05% cream twice a day times several days and then nightly for several weeks. Will taper assuming she has a good response. Possible need for repeat treatments for recurrences was discussed with her. Follow up if this does not help or if she notices any visual or palpable abnormalities.    Anastasio Auerbach MD, 4:18 PM 02/27/2015

## 2015-02-27 NOTE — Addendum Note (Signed)
Addended by: Nelva Nay on: 02/27/2015 04:23 PM   Modules accepted: Orders

## 2015-02-28 ENCOUNTER — Telehealth: Payer: Self-pay | Admitting: *Deleted

## 2015-02-28 NOTE — Telephone Encounter (Signed)
Pt is due for mammogram screening in Nov. States she was told to get 3D mammogram due to right breast cyst. Pt said she saw you yesterday for problem visit and spoke to you about a possible right breast ultrasound same day as mammogram if needed? Please advise

## 2015-02-28 NOTE — Telephone Encounter (Signed)
I just sent a message to you about this before reading this encounter.

## 2015-03-01 NOTE — Telephone Encounter (Signed)
Left message for pt to call.

## 2015-03-01 NOTE — Telephone Encounter (Signed)
Per Dr.Fontaine staff message " If you could just call the patient and let her know we tried but they will not schedule an ultrasound until they see the mammogram first. They will then schedule it if they need to.

## 2015-03-04 ENCOUNTER — Other Ambulatory Visit: Payer: Self-pay

## 2015-03-04 DIAGNOSIS — Z1231 Encounter for screening mammogram for malignant neoplasm of breast: Secondary | ICD-10-CM

## 2015-03-04 NOTE — Telephone Encounter (Signed)
Per Dr.Fontaine staff message " If you could just call the patient and let her know we tried but they will not schedule an ultrasound until they see the mammogram first. They will then schedule it if they need to.        I called and spoke with pt about this and she will schedule mammogram in Nov. 2016 and call our office within 2 day to get mammogram results from epic.

## 2015-03-18 DIAGNOSIS — R5381 Other malaise: Secondary | ICD-10-CM | POA: Diagnosis not present

## 2015-03-18 DIAGNOSIS — G4700 Insomnia, unspecified: Secondary | ICD-10-CM | POA: Diagnosis not present

## 2015-03-18 DIAGNOSIS — M797 Fibromyalgia: Secondary | ICD-10-CM | POA: Diagnosis not present

## 2015-03-28 ENCOUNTER — Encounter: Payer: Self-pay | Admitting: Gynecology

## 2015-03-28 ENCOUNTER — Ambulatory Visit (INDEPENDENT_AMBULATORY_CARE_PROVIDER_SITE_OTHER): Payer: Medicare Other | Admitting: Gynecology

## 2015-03-28 VITALS — BP 120/66

## 2015-03-28 DIAGNOSIS — N644 Mastodynia: Secondary | ICD-10-CM

## 2015-03-28 NOTE — Progress Notes (Signed)
Rebecca Newton 10/10/1945 [id number]        69 y.o.  G1P1001 presents complaining of bilateral tail of Spence breast pain over the last 2 weeks. No masses felt. Called to arrange for her mammogram and they referred her to me first. No nipple discharge  Past medical history,surgical history, problem list, medications, allergies, family history and social history were all reviewed and documented in the EPIC chart.  Directed ROS with pertinent positives and negatives documented in the history of present illness/assessment and plan.  Exam: Kim assistant Filed Vitals:   03/28/15 1507  BP: 120/66   General appearance:  Normal Both breasts exam lying and sitting without masses retractions discharge adenopathy.  Assessment/Plan:  69 y.o. G1P1001 with lateral tail of Spence mastalgia. Exam is normal. We'll plan diagnostic mammography and bilateral ultrasounds to rule out pathology and reassure the patient. Patient agrees with the plan and we will make arrangements for this.    Anastasio Auerbach MD, 3:31 PM 03/28/2015

## 2015-03-28 NOTE — Patient Instructions (Signed)
Office will call you to arrange for the mammography and ultrasounds.

## 2015-03-29 ENCOUNTER — Telehealth: Payer: Self-pay | Admitting: *Deleted

## 2015-03-29 DIAGNOSIS — M79621 Pain in right upper arm: Secondary | ICD-10-CM

## 2015-03-29 DIAGNOSIS — M79622 Pain in left upper arm: Secondary | ICD-10-CM

## 2015-03-29 NOTE — Telephone Encounter (Signed)
-----   Message from Anastasio Auerbach, MD sent at 03/28/2015  3:29 PM EDT ----- Schedule bilateral diagnostic mammography and bilateral breast ultrasounds reference new onset bilateral tail of Spence pain. Physical exam is negative.

## 2015-03-29 NOTE — Telephone Encounter (Signed)
Appointment 04/05/15 @ 7:30am at breast center pt aware.

## 2015-04-05 ENCOUNTER — Ambulatory Visit
Admission: RE | Admit: 2015-04-05 | Discharge: 2015-04-05 | Disposition: A | Discharge status: Home / Self Care | Payer: Medicare Other | Source: Ambulatory Visit | Attending: Gynecology | Admitting: Gynecology

## 2015-04-05 ENCOUNTER — Other Ambulatory Visit: Payer: Self-pay | Admitting: Gynecology

## 2015-04-05 ENCOUNTER — Other Ambulatory Visit: Payer: Self-pay

## 2015-04-05 DIAGNOSIS — M79622 Pain in left upper arm: Secondary | ICD-10-CM

## 2015-04-05 DIAGNOSIS — M79621 Pain in right upper arm: Secondary | ICD-10-CM

## 2015-04-05 DIAGNOSIS — N6310 Unspecified lump in the right breast, unspecified quadrant: Secondary | ICD-10-CM

## 2015-04-05 DIAGNOSIS — N6011 Diffuse cystic mastopathy of right breast: Secondary | ICD-10-CM | POA: Diagnosis not present

## 2015-04-05 DIAGNOSIS — N644 Mastodynia: Secondary | ICD-10-CM | POA: Diagnosis not present

## 2015-04-15 ENCOUNTER — Ambulatory Visit (INDEPENDENT_AMBULATORY_CARE_PROVIDER_SITE_OTHER): Payer: Medicare Other | Admitting: Gynecology

## 2015-04-15 ENCOUNTER — Encounter: Payer: Self-pay | Admitting: Gynecology

## 2015-04-15 VITALS — BP 118/80

## 2015-04-15 DIAGNOSIS — N644 Mastodynia: Secondary | ICD-10-CM

## 2015-04-15 NOTE — Progress Notes (Signed)
Carry Rebecca Newton July 23, 1946 [id number]        69 y.o.  G1P1001 Presents with many questions pertaining to her most recent mammography and ultrasound.  The mammogram was normal but on right breast ultrasound there were several areas suspicious for cysts versus apocrine metaplasia which was similarly observed in November 2015. Their initial recommendation was a follow up ultrasound in 6 months to confirm stability. The patient was extremely concerned and ultimately discussed this with them and they arranged to biopsy this area she has this scheduled 04/24/2015. Patient wanted my opinion  Past medical history,surgical history, problem list, medications, allergies, family history and social history were all reviewed and documented in the EPIC chart.  Directed ROS with pertinent positives and negatives documented in the history of present illness/assessment and plan.  Exam: Filed Vitals:   04/15/15 1012  BP: 118/80   General appearance:  Normal  Assessment/Plan:  69 y.o. G1P1001 with history as above. After a lengthy discussion with the patient due to her extreme anxiety saying I could never wait 6 months I feel biopsy of this area certainly appropriate for reassurance of a benign etiology. Patient's going to follow up with radiology as arranged for the biopsy. If any question or suspicion on the biopsy then she will see a general surgeon and she knows the plan.  Total time face-to-face discussion approximately 15 minutes.    Anastasio Auerbach MD, 10:57 AM 04/15/2015

## 2015-04-15 NOTE — Patient Instructions (Signed)
Follow up for breast biopsy as arranged through radiology.

## 2015-04-22 DIAGNOSIS — G4700 Insomnia, unspecified: Secondary | ICD-10-CM | POA: Diagnosis not present

## 2015-04-22 DIAGNOSIS — B373 Candidiasis of vulva and vagina: Secondary | ICD-10-CM | POA: Diagnosis not present

## 2015-04-22 DIAGNOSIS — M797 Fibromyalgia: Secondary | ICD-10-CM | POA: Diagnosis not present

## 2015-04-23 DIAGNOSIS — L719 Rosacea, unspecified: Secondary | ICD-10-CM | POA: Diagnosis not present

## 2015-04-23 DIAGNOSIS — L281 Prurigo nodularis: Secondary | ICD-10-CM | POA: Diagnosis not present

## 2015-04-23 DIAGNOSIS — D485 Neoplasm of uncertain behavior of skin: Secondary | ICD-10-CM | POA: Diagnosis not present

## 2015-04-24 ENCOUNTER — Other Ambulatory Visit: Payer: Self-pay

## 2015-04-24 ENCOUNTER — Ambulatory Visit
Admission: RE | Admit: 2015-04-24 | Discharge: 2015-04-24 | Disposition: A | Discharge status: Home / Self Care | Payer: Medicare Other | Source: Ambulatory Visit

## 2015-04-24 DIAGNOSIS — R928 Other abnormal and inconclusive findings on diagnostic imaging of breast: Secondary | ICD-10-CM | POA: Diagnosis not present

## 2015-04-24 DIAGNOSIS — N6011 Diffuse cystic mastopathy of right breast: Secondary | ICD-10-CM | POA: Diagnosis not present

## 2015-04-24 DIAGNOSIS — N6001 Solitary cyst of right breast: Secondary | ICD-10-CM | POA: Diagnosis not present

## 2015-04-24 DIAGNOSIS — N6310 Unspecified lump in the right breast, unspecified quadrant: Secondary | ICD-10-CM

## 2015-05-27 DIAGNOSIS — H01022 Squamous blepharitis right lower eyelid: Secondary | ICD-10-CM | POA: Diagnosis not present

## 2015-05-27 DIAGNOSIS — H01021 Squamous blepharitis right upper eyelid: Secondary | ICD-10-CM | POA: Diagnosis not present

## 2015-05-27 DIAGNOSIS — H01025 Squamous blepharitis left lower eyelid: Secondary | ICD-10-CM | POA: Diagnosis not present

## 2015-05-27 DIAGNOSIS — H43821 Vitreomacular adhesion, right eye: Secondary | ICD-10-CM | POA: Diagnosis not present

## 2015-05-27 DIAGNOSIS — H2513 Age-related nuclear cataract, bilateral: Secondary | ICD-10-CM | POA: Diagnosis not present

## 2015-05-27 DIAGNOSIS — H01024 Squamous blepharitis left upper eyelid: Secondary | ICD-10-CM | POA: Diagnosis not present

## 2015-06-03 ENCOUNTER — Ambulatory Visit
Admission: RE | Admit: 2015-06-03 | Discharge: 2015-06-03 | Disposition: A | Discharge status: Home / Self Care | Payer: Medicare Other | Source: Ambulatory Visit

## 2015-06-03 DIAGNOSIS — Z1231 Encounter for screening mammogram for malignant neoplasm of breast: Secondary | ICD-10-CM

## 2015-06-06 DIAGNOSIS — H52221 Regular astigmatism, right eye: Secondary | ICD-10-CM | POA: Diagnosis not present

## 2015-06-06 DIAGNOSIS — H2511 Age-related nuclear cataract, right eye: Secondary | ICD-10-CM | POA: Diagnosis not present

## 2015-06-17 DIAGNOSIS — Z1389 Encounter for screening for other disorder: Secondary | ICD-10-CM | POA: Diagnosis not present

## 2015-06-17 DIAGNOSIS — R1032 Left lower quadrant pain: Secondary | ICD-10-CM | POA: Diagnosis not present

## 2015-06-17 DIAGNOSIS — F413 Other mixed anxiety disorders: Secondary | ICD-10-CM | POA: Diagnosis not present

## 2015-06-17 DIAGNOSIS — G43009 Migraine without aura, not intractable, without status migrainosus: Secondary | ICD-10-CM | POA: Diagnosis not present

## 2015-06-17 DIAGNOSIS — E782 Mixed hyperlipidemia: Secondary | ICD-10-CM | POA: Diagnosis not present

## 2015-06-17 DIAGNOSIS — R7309 Other abnormal glucose: Secondary | ICD-10-CM | POA: Diagnosis not present

## 2015-06-17 DIAGNOSIS — K219 Gastro-esophageal reflux disease without esophagitis: Secondary | ICD-10-CM | POA: Diagnosis not present

## 2015-06-17 DIAGNOSIS — H6980 Other specified disorders of Eustachian tube, unspecified ear: Secondary | ICD-10-CM | POA: Diagnosis not present

## 2015-06-17 DIAGNOSIS — Z0000 Encounter for general adult medical examination without abnormal findings: Secondary | ICD-10-CM | POA: Diagnosis not present

## 2015-06-17 DIAGNOSIS — G4700 Insomnia, unspecified: Secondary | ICD-10-CM | POA: Diagnosis not present

## 2015-06-17 DIAGNOSIS — M797 Fibromyalgia: Secondary | ICD-10-CM | POA: Diagnosis not present

## 2015-07-08 DIAGNOSIS — H52222 Regular astigmatism, left eye: Secondary | ICD-10-CM | POA: Diagnosis not present

## 2015-07-08 DIAGNOSIS — H2512 Age-related nuclear cataract, left eye: Secondary | ICD-10-CM | POA: Diagnosis not present

## 2015-10-28 ENCOUNTER — Ambulatory Visit
Admission: RE | Admit: 2015-10-28 | Discharge: 2015-10-28 | Disposition: A | Discharge status: Home / Self Care | Payer: Medicare Other | Source: Ambulatory Visit | Attending: Internal Medicine | Admitting: Internal Medicine

## 2015-10-28 ENCOUNTER — Other Ambulatory Visit: Payer: Self-pay | Admitting: Internal Medicine

## 2015-10-28 DIAGNOSIS — M797 Fibromyalgia: Secondary | ICD-10-CM | POA: Diagnosis not present

## 2015-10-28 DIAGNOSIS — R739 Hyperglycemia, unspecified: Secondary | ICD-10-CM | POA: Diagnosis not present

## 2015-10-28 DIAGNOSIS — E782 Mixed hyperlipidemia: Secondary | ICD-10-CM | POA: Diagnosis not present

## 2015-10-28 DIAGNOSIS — R059 Cough, unspecified: Secondary | ICD-10-CM

## 2015-10-28 DIAGNOSIS — R7303 Prediabetes: Secondary | ICD-10-CM | POA: Diagnosis not present

## 2015-10-28 DIAGNOSIS — G4701 Insomnia due to medical condition: Secondary | ICD-10-CM | POA: Diagnosis not present

## 2015-10-28 DIAGNOSIS — R0602 Shortness of breath: Secondary | ICD-10-CM | POA: Diagnosis not present

## 2015-10-28 DIAGNOSIS — B009 Herpesviral infection, unspecified: Secondary | ICD-10-CM | POA: Diagnosis not present

## 2015-10-28 DIAGNOSIS — R05 Cough: Secondary | ICD-10-CM | POA: Diagnosis not present

## 2015-10-28 DIAGNOSIS — K219 Gastro-esophageal reflux disease without esophagitis: Secondary | ICD-10-CM | POA: Diagnosis not present

## 2015-10-28 DIAGNOSIS — F413 Other mixed anxiety disorders: Secondary | ICD-10-CM | POA: Diagnosis not present

## 2015-10-28 DIAGNOSIS — B373 Candidiasis of vulva and vagina: Secondary | ICD-10-CM | POA: Diagnosis not present

## 2015-10-28 DIAGNOSIS — G500 Trigeminal neuralgia: Secondary | ICD-10-CM | POA: Diagnosis not present

## 2015-10-28 DIAGNOSIS — Z79899 Other long term (current) drug therapy: Secondary | ICD-10-CM | POA: Diagnosis not present

## 2015-11-05 DIAGNOSIS — G4709 Other insomnia: Secondary | ICD-10-CM | POA: Diagnosis not present

## 2015-11-05 DIAGNOSIS — R5381 Other malaise: Secondary | ICD-10-CM | POA: Diagnosis not present

## 2015-11-05 DIAGNOSIS — R8290 Unspecified abnormal findings in urine: Secondary | ICD-10-CM | POA: Diagnosis not present

## 2015-11-05 DIAGNOSIS — M797 Fibromyalgia: Secondary | ICD-10-CM | POA: Diagnosis not present

## 2015-11-05 DIAGNOSIS — M5030 Other cervical disc degeneration, unspecified cervical region: Secondary | ICD-10-CM | POA: Diagnosis not present

## 2015-11-05 DIAGNOSIS — K582 Mixed irritable bowel syndrome: Secondary | ICD-10-CM | POA: Diagnosis not present

## 2015-11-05 DIAGNOSIS — N3010 Interstitial cystitis (chronic) without hematuria: Secondary | ICD-10-CM | POA: Diagnosis not present

## 2015-11-14 ENCOUNTER — Telehealth: Payer: Self-pay | Admitting: *Deleted

## 2015-11-14 DIAGNOSIS — M8580 Other specified disorders of bone density and structure, unspecified site: Secondary | ICD-10-CM

## 2015-11-14 NOTE — Telephone Encounter (Signed)
Pt called requesting to schedule dexa for June 2017. Order placed, pt states she will not be coming in for gyn annual appointments as medicare is no longer paying per patient. Pt will be informed we will no longer be filling her valium for her if no annual visits.

## 2015-11-15 ENCOUNTER — Other Ambulatory Visit: Payer: Self-pay

## 2015-11-15 DIAGNOSIS — Z1231 Encounter for screening mammogram for malignant neoplasm of breast: Secondary | ICD-10-CM

## 2015-12-26 DIAGNOSIS — M8580 Other specified disorders of bone density and structure, unspecified site: Secondary | ICD-10-CM

## 2015-12-26 HISTORY — DX: Other specified disorders of bone density and structure, unspecified site: M85.80

## 2016-01-07 ENCOUNTER — Other Ambulatory Visit: Payer: Self-pay | Admitting: Gynecology

## 2016-01-07 ENCOUNTER — Ambulatory Visit (INDEPENDENT_AMBULATORY_CARE_PROVIDER_SITE_OTHER): Payer: Medicare Other

## 2016-01-07 DIAGNOSIS — M899 Disorder of bone, unspecified: Secondary | ICD-10-CM | POA: Diagnosis not present

## 2016-01-07 DIAGNOSIS — M8580 Other specified disorders of bone density and structure, unspecified site: Secondary | ICD-10-CM

## 2016-01-08 ENCOUNTER — Encounter: Payer: Self-pay | Admitting: Gynecology

## 2016-01-16 DIAGNOSIS — E782 Mixed hyperlipidemia: Secondary | ICD-10-CM | POA: Diagnosis not present

## 2016-01-16 DIAGNOSIS — E785 Hyperlipidemia, unspecified: Secondary | ICD-10-CM | POA: Diagnosis not present

## 2016-01-21 DIAGNOSIS — E782 Mixed hyperlipidemia: Secondary | ICD-10-CM | POA: Diagnosis not present

## 2016-01-21 DIAGNOSIS — Z79899 Other long term (current) drug therapy: Secondary | ICD-10-CM | POA: Diagnosis not present

## 2016-02-17 ENCOUNTER — Ambulatory Visit: Payer: Medicare Other | Admitting: Gynecology

## 2016-03-17 DIAGNOSIS — M791 Myalgia: Secondary | ICD-10-CM | POA: Diagnosis not present

## 2016-03-17 DIAGNOSIS — G4709 Other insomnia: Secondary | ICD-10-CM | POA: Diagnosis not present

## 2016-03-17 DIAGNOSIS — M6080 Other myositis, unspecified site: Secondary | ICD-10-CM | POA: Diagnosis not present

## 2016-03-17 DIAGNOSIS — M797 Fibromyalgia: Secondary | ICD-10-CM | POA: Diagnosis not present

## 2016-03-17 DIAGNOSIS — M25571 Pain in right ankle and joints of right foot: Secondary | ICD-10-CM | POA: Diagnosis not present

## 2016-03-17 DIAGNOSIS — Z79899 Other long term (current) drug therapy: Secondary | ICD-10-CM | POA: Diagnosis not present

## 2016-03-17 DIAGNOSIS — E782 Mixed hyperlipidemia: Secondary | ICD-10-CM | POA: Diagnosis not present

## 2016-03-17 DIAGNOSIS — R5381 Other malaise: Secondary | ICD-10-CM | POA: Diagnosis not present

## 2016-03-31 DIAGNOSIS — Z961 Presence of intraocular lens: Secondary | ICD-10-CM | POA: Diagnosis not present

## 2016-03-31 DIAGNOSIS — H2513 Age-related nuclear cataract, bilateral: Secondary | ICD-10-CM | POA: Diagnosis not present

## 2016-03-31 DIAGNOSIS — H43821 Vitreomacular adhesion, right eye: Secondary | ICD-10-CM | POA: Diagnosis not present

## 2016-03-31 DIAGNOSIS — H01024 Squamous blepharitis left upper eyelid: Secondary | ICD-10-CM | POA: Diagnosis not present

## 2016-03-31 DIAGNOSIS — H01021 Squamous blepharitis right upper eyelid: Secondary | ICD-10-CM | POA: Diagnosis not present

## 2016-03-31 DIAGNOSIS — H01025 Squamous blepharitis left lower eyelid: Secondary | ICD-10-CM | POA: Diagnosis not present

## 2016-03-31 DIAGNOSIS — H16223 Keratoconjunctivitis sicca, not specified as Sjogren's, bilateral: Secondary | ICD-10-CM | POA: Diagnosis not present

## 2016-03-31 DIAGNOSIS — H01022 Squamous blepharitis right lower eyelid: Secondary | ICD-10-CM | POA: Diagnosis not present

## 2016-04-02 DIAGNOSIS — L718 Other rosacea: Secondary | ICD-10-CM | POA: Diagnosis not present

## 2016-04-02 DIAGNOSIS — L820 Inflamed seborrheic keratosis: Secondary | ICD-10-CM | POA: Diagnosis not present

## 2016-04-06 DIAGNOSIS — L259 Unspecified contact dermatitis, unspecified cause: Secondary | ICD-10-CM | POA: Diagnosis not present

## 2016-04-20 DIAGNOSIS — L509 Urticaria, unspecified: Secondary | ICD-10-CM | POA: Diagnosis not present

## 2016-04-20 DIAGNOSIS — L247 Irritant contact dermatitis due to plants, except food: Secondary | ICD-10-CM | POA: Diagnosis not present

## 2016-06-03 ENCOUNTER — Ambulatory Visit
Admission: RE | Admit: 2016-06-03 | Discharge: 2016-06-03 | Disposition: A | Discharge status: Home / Self Care | Payer: Medicare Other | Source: Ambulatory Visit

## 2016-06-03 DIAGNOSIS — Z1231 Encounter for screening mammogram for malignant neoplasm of breast: Secondary | ICD-10-CM | POA: Diagnosis not present

## 2016-06-04 ENCOUNTER — Telehealth: Payer: Self-pay | Admitting: *Deleted

## 2016-06-04 NOTE — Telephone Encounter (Signed)
Pt informed with normal mammogram on 06/02/16

## 2016-06-22 DIAGNOSIS — E782 Mixed hyperlipidemia: Secondary | ICD-10-CM | POA: Diagnosis not present

## 2016-06-22 DIAGNOSIS — K219 Gastro-esophageal reflux disease without esophagitis: Secondary | ICD-10-CM | POA: Diagnosis not present

## 2016-06-22 DIAGNOSIS — Z1389 Encounter for screening for other disorder: Secondary | ICD-10-CM | POA: Diagnosis not present

## 2016-06-22 DIAGNOSIS — G4700 Insomnia, unspecified: Secondary | ICD-10-CM | POA: Diagnosis not present

## 2016-06-22 DIAGNOSIS — E7800 Pure hypercholesterolemia, unspecified: Secondary | ICD-10-CM | POA: Diagnosis not present

## 2016-06-22 DIAGNOSIS — Z0001 Encounter for general adult medical examination with abnormal findings: Secondary | ICD-10-CM | POA: Diagnosis not present

## 2016-06-22 DIAGNOSIS — R030 Elevated blood-pressure reading, without diagnosis of hypertension: Secondary | ICD-10-CM | POA: Diagnosis not present

## 2016-06-22 DIAGNOSIS — G43009 Migraine without aura, not intractable, without status migrainosus: Secondary | ICD-10-CM | POA: Diagnosis not present

## 2016-06-22 DIAGNOSIS — E669 Obesity, unspecified: Secondary | ICD-10-CM | POA: Diagnosis not present

## 2016-06-22 DIAGNOSIS — L247 Irritant contact dermatitis due to plants, except food: Secondary | ICD-10-CM | POA: Diagnosis not present

## 2016-06-22 DIAGNOSIS — F413 Other mixed anxiety disorders: Secondary | ICD-10-CM | POA: Diagnosis not present

## 2016-07-22 DIAGNOSIS — R151 Fecal smearing: Secondary | ICD-10-CM | POA: Diagnosis not present

## 2016-08-04 ENCOUNTER — Encounter: Payer: Medicare Other | Admitting: Gynecology

## 2016-08-04 DIAGNOSIS — E782 Mixed hyperlipidemia: Secondary | ICD-10-CM | POA: Diagnosis not present

## 2016-08-04 DIAGNOSIS — R7303 Prediabetes: Secondary | ICD-10-CM | POA: Diagnosis not present

## 2016-08-04 DIAGNOSIS — R030 Elevated blood-pressure reading, without diagnosis of hypertension: Secondary | ICD-10-CM | POA: Diagnosis not present

## 2016-08-07 DIAGNOSIS — E669 Obesity, unspecified: Secondary | ICD-10-CM | POA: Diagnosis not present

## 2016-08-07 DIAGNOSIS — Z79899 Other long term (current) drug therapy: Secondary | ICD-10-CM | POA: Diagnosis not present

## 2016-08-07 DIAGNOSIS — R030 Elevated blood-pressure reading, without diagnosis of hypertension: Secondary | ICD-10-CM | POA: Diagnosis not present

## 2016-08-07 DIAGNOSIS — E782 Mixed hyperlipidemia: Secondary | ICD-10-CM | POA: Diagnosis not present

## 2016-08-07 DIAGNOSIS — R151 Fecal smearing: Secondary | ICD-10-CM | POA: Diagnosis not present

## 2016-08-07 DIAGNOSIS — R7303 Prediabetes: Secondary | ICD-10-CM | POA: Diagnosis not present

## 2016-08-11 ENCOUNTER — Encounter: Payer: Medicare Other | Admitting: Gynecology

## 2016-08-19 DIAGNOSIS — R151 Fecal smearing: Secondary | ICD-10-CM | POA: Diagnosis not present

## 2016-08-19 DIAGNOSIS — N952 Postmenopausal atrophic vaginitis: Secondary | ICD-10-CM | POA: Diagnosis not present

## 2016-08-19 DIAGNOSIS — N816 Rectocele: Secondary | ICD-10-CM | POA: Diagnosis not present

## 2016-08-27 ENCOUNTER — Ambulatory Visit (INDEPENDENT_AMBULATORY_CARE_PROVIDER_SITE_OTHER): Payer: Medicare Other | Admitting: Gynecology

## 2016-08-27 ENCOUNTER — Encounter: Payer: Self-pay | Admitting: Gynecology

## 2016-08-27 VITALS — BP 124/78 | Ht 63.0 in

## 2016-08-27 DIAGNOSIS — M8589 Other specified disorders of bone density and structure, multiple sites: Secondary | ICD-10-CM

## 2016-08-27 DIAGNOSIS — Z1231 Encounter for screening mammogram for malignant neoplasm of breast: Secondary | ICD-10-CM | POA: Diagnosis not present

## 2016-08-27 DIAGNOSIS — M8580 Other specified disorders of bone density and structure, unspecified site: Secondary | ICD-10-CM

## 2016-08-27 DIAGNOSIS — Z1239 Encounter for other screening for malignant neoplasm of breast: Secondary | ICD-10-CM

## 2016-08-27 DIAGNOSIS — N816 Rectocele: Secondary | ICD-10-CM | POA: Diagnosis not present

## 2016-08-27 NOTE — Progress Notes (Signed)
    Rebecca Newton 1945-11-13 [id number]        70 y.o.  G1P1001 for breast exam and to discuss her recent appointment with Dr. Zigmund Daniel.  Saw her recently with fecal incontinence, vaginal atrophy and stage II rectocele. Has appointment to see her end of this month. Patient declines pelvic exam stating that she recently was examined by Dr. Zigmund Daniel.  Past medical history,surgical history, problem list, medications, allergies, family history and social history were all reviewed and documented as reviewed in the EPIC chart.  ROS:  Performed with pertinent positives and negatives included in the history, assessment and plan.   Additional significant findings :  None   Exam: Caryn Bee assistant Vitals:   08/27/16 1402  BP: 124/78  Height: 5\' 3"  (1.6 m)   Height 5 foot 3 inches weight refused  General appearance:  Normal affect, orientation and appearance. Skin: Grossly normal HEENT: Without gross lesions.  No cervical or supraclavicular adenopathy. Thyroid normal.  Lungs:  Clear without wheezing, rales or rhonchi Cardiac: RR, without RMG Abdominal:  Soft, nontender, without masses, guarding, rebound, organomegaly or hernia Breasts:  Examined lying and sitting without masses, retractions, discharge or axillary adenopathy.   Assessment/Plan:  71 y.o. G72P1001 female for breast exam and to discuss recent appointment with Dr. Zigmund Daniel  1. Appointment with Dr. Zigmund Daniel. Rectocele, vaginal atrophy and fecal incontinence.  Her nose is in Epic and complete. Has follow up appointment with her. Patient S/P in general what type of surgery Dr. Zigmund Daniel would do and I reviewed with her that I do not know specifically what she has planned but did discuss in general what a rectocele repair involves with or without graft. Patient will follow up with Dr. Zigmund Daniel as scheduled. 2. Breast exam is normal. Mammography 05/2016. Continue with annual mammography when due. SBE monthly reviewed. 3. Pap  smear 2016. No Pap smear done today. No history of significant abnormal Pap smears. Discussed current screening recommendations and we both agree to stop screening based on age and hysterectomy history. 4. Plan repeat DEXA in another year or 2. Increase calcium vitamin D. 5. Health maintenance. No routine lab work done as patient does this elsewhere. Follow up in one year, sooner as needed.   Anastasio Auerbach MD, 2:38 PM 08/27/2016

## 2016-08-27 NOTE — Patient Instructions (Signed)

## 2016-09-03 ENCOUNTER — Encounter: Payer: Self-pay | Admitting: Rheumatology

## 2016-09-03 ENCOUNTER — Ambulatory Visit (INDEPENDENT_AMBULATORY_CARE_PROVIDER_SITE_OTHER): Payer: Medicare Other

## 2016-09-03 ENCOUNTER — Ambulatory Visit (INDEPENDENT_AMBULATORY_CARE_PROVIDER_SITE_OTHER): Payer: Medicare Other | Admitting: Rheumatology

## 2016-09-03 ENCOUNTER — Ambulatory Visit (INDEPENDENT_AMBULATORY_CARE_PROVIDER_SITE_OTHER): Payer: Self-pay

## 2016-09-03 VITALS — BP 120/72 | HR 66 | Resp 16 | Ht 61.0 in | Wt 158.0 lb

## 2016-09-03 DIAGNOSIS — F5101 Primary insomnia: Secondary | ICD-10-CM

## 2016-09-03 DIAGNOSIS — G8929 Other chronic pain: Secondary | ICD-10-CM

## 2016-09-03 DIAGNOSIS — M25561 Pain in right knee: Secondary | ICD-10-CM

## 2016-09-03 DIAGNOSIS — M25562 Pain in left knee: Secondary | ICD-10-CM | POA: Diagnosis not present

## 2016-09-03 DIAGNOSIS — R5383 Other fatigue: Secondary | ICD-10-CM

## 2016-09-03 DIAGNOSIS — M797 Fibromyalgia: Secondary | ICD-10-CM

## 2016-09-03 NOTE — Progress Notes (Signed)
Office Visit Note  Patient: Rebecca Newton             Date of Birth: 1946/05/14           MRN: [id number]             PCP: Henrine Screws, MD Referring: Josetta Huddle, MD Visit Date: 09/03/2016 Occupation: @GUAROCC @    Subjective:  Follow-up Fibromyalgia.   History of Present Illness: CHANETTE KILL is a 71 y.o. female  Last seen 03/17/2016 Patient is stating her fibromyalgia discomfort is aggravated right now. She describes her pain as 8-9 on a scale of 0-10.  She has ongoing fatigue and insomnia.  Today she does not want any cortisone injections.  Ongoing pain to bilateral knees. We don't have any updated x-rays of the knees and we will provide them for the patient today. We discussed Visco supplementation as a treatment option in the future should she required based on the x-rays.  She has ongoing neck pain from degenerative disc disease.  She has OA of the feet and it's causing her some pain and discomfort.  She gets labs done at her PCPs office, Dr. Mertha Finders at [place]. The last labs that we have for the patient is dated 03/17/2016. She reports that she went in December 2017 and had further labs done and they will send it to our office. We do not give any medications at this time to the patient and we will wait for the labs from the PCPs office.   Activities of Daily Living:  Patient reports morning stiffness for 15 minutes.   Patient Reports nocturnal pain.  Difficulty dressing/grooming: Reports Difficulty climbing stairs: Reports Difficulty getting out of chair: Reports Difficulty using hands for taps, buttons, cutlery, and/or writing: Reports   No Rheumatology ROS completed.   PMFS History:  Patient Active Problem List   Diagnosis Date Noted  . 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 Osteopenia   . Interstitial cystitis   . Fibromyalgia     Past Medical History:  Diagnosis Date  . Fibromyalgia   . Interstitial cystitis   .  Lichen sclerosus   . Osteopenia 12/2015   T score -1.2 FRAX 14%/1.6% stable from prior DEXA    Family History  Problem Relation Age of Onset  . Lung cancer Mother   . Heart disease Mother   . Stroke Father   . Diabetes Maternal Grandmother   . Heart disease Maternal Grandmother   . Breast cancer Sister     Age 69   Past Surgical History:  Procedure Laterality Date  . BILATERAL SALPINGOOPHORECTOMY    . COMBINED HYSTEROSCOPY DIAGNOSTIC / D&C  2004  . HYSTEROSCOPY    . LAPAROSCOPIC ASSISTED VAGINAL HYSTERECTOMY  2007   Leiomyoma/adenomyosis  . ROTATOR CUFF REPAIR  2006   Social History   Social History Narrative  . No narrative on file     Objective: Vital Signs: BP 120/72   Pulse 66   Resp 16   Ht 5\' 1"  (1.549 m)   Wt 158 lb (71.7 kg)   BMI 29.85 kg/m    Physical Exam   Musculoskeletal Exam:  Full range of motion of all joints Grip strength is equal and strong bilaterally Fiber myalgia tender points are all absent  CDAI Exam: No CDAI exam completed.    Investigation: No additional findings. No visits with results within 6 Month(s) from this visit.  Latest known visit with results is:  Office Visit on 02/27/2015  Component Date Value Ref Range Status  . Yeast Wet Prep HPF POC 02/27/2015 NONE SEEN  NONE SEEN Final  . Trich, Wet Prep 02/27/2015 NONE SEEN  NONE SEEN Final  . Clue Cells Wet Prep HPF POC 02/27/2015 NONE SEEN  NONE SEEN Final  . WBC, Wet Prep HPF POC 02/27/2015 RARE  NONE SEEN Final   Comment: RARE BACTERIA SEEN (7-12) EPITH. CELLS PER HPF AMINE NEGATIVE      Imaging: Xr Knee 3 View Left  Result Date: 09/03/2016 Left knee 3 views #1: Moderate medial compartment of the left knee. #2: No CPPD. #3: No osteophytes. #4: Moderate patellofemoral joint space narrowing. #5: Lateral tracking of the patella on the sunrise view. Impression: #1: Moderate osteoarthritis of the left knee.   Xr Knee 3 View Right  Result Date: 09/03/2016 Right knee 3  views #1: Moderate medial compartment of the right knee. #2: No CPPD. #3: No osteophytes. #4: Moderate patellofemoral joint space narrowing. #5: Lateral tracking of the patella on the sunrise view. Impression: #1: Moderate osteoarthritis of the right knee.   Speciality Comments: No specialty comments available.    Procedures:  No procedures performed Allergies: Macrodantin and Codeine   Assessment / Plan:     Visit Diagnoses: Fibromyalgia  Fatigue, unspecified type  Primary insomnia  Bilateral chronic knee pain - Plan: XR KNEE 3 VIEW RIGHT, XR KNEE 3 VIEW LEFT  X-rays of both knee shows moderate medial compartment narrowing. No CPPD. Lateral tracking of both patellas. No osteophytes. We offered the patient cortisone injection but she is not hurting so bad that she needs it at this time. She'll call us if she needs it.  We discussed in detail the importance of using Visco supplementation at the proper time. She'll let us know when she wants to get started on that but she knows that she has to fail cortisone injection first.  Return to clinic in 5 months  Orders: Orders Placed This Encounter  Procedures  . XR KNEE 3 VIEW RIGHT  . XR KNEE 3 VIEW LEFT   No orders of the defined types were placed in this encounter.   Face-to-face time spent with patient was 30 minutes. 50% of time was spent in counseling and coordination of care.  Follow-Up Instructions: Return in about 4 months (around 01/01/2017) for FMS, FATIGUE,INSOMNIA, bil knee joint, oa kj Mod.   Eliezer Lofts, PA-C  Note - This record has been created using [employer].  Chart creation errors have been sought, but may not always  have been located. Such creation errors do not reflect on  the standard of medical care.

## 2016-09-23 DIAGNOSIS — N3010 Interstitial cystitis (chronic) without hematuria: Secondary | ICD-10-CM | POA: Diagnosis not present

## 2016-09-23 DIAGNOSIS — R151 Fecal smearing: Secondary | ICD-10-CM | POA: Diagnosis not present

## 2016-09-23 DIAGNOSIS — N816 Rectocele: Secondary | ICD-10-CM | POA: Diagnosis not present

## 2016-09-24 DIAGNOSIS — Z79899 Other long term (current) drug therapy: Secondary | ICD-10-CM | POA: Diagnosis not present

## 2016-09-24 DIAGNOSIS — R7303 Prediabetes: Secondary | ICD-10-CM | POA: Diagnosis not present

## 2016-10-27 DIAGNOSIS — L719 Rosacea, unspecified: Secondary | ICD-10-CM | POA: Diagnosis not present

## 2016-11-10 DIAGNOSIS — N3010 Interstitial cystitis (chronic) without hematuria: Secondary | ICD-10-CM | POA: Diagnosis not present

## 2016-11-24 ENCOUNTER — Other Ambulatory Visit: Payer: Self-pay | Admitting: Internal Medicine

## 2016-11-24 DIAGNOSIS — Z1231 Encounter for screening mammogram for malignant neoplasm of breast: Secondary | ICD-10-CM

## 2016-11-26 DIAGNOSIS — L237 Allergic contact dermatitis due to plants, except food: Secondary | ICD-10-CM | POA: Diagnosis not present

## 2016-12-10 ENCOUNTER — Ambulatory Visit: Payer: Medicare Other | Admitting: Rheumatology

## 2016-12-14 DIAGNOSIS — G5791 Unspecified mononeuropathy of right lower limb: Secondary | ICD-10-CM | POA: Diagnosis not present

## 2016-12-15 ENCOUNTER — Other Ambulatory Visit: Payer: Self-pay | Admitting: Internal Medicine

## 2016-12-15 ENCOUNTER — Ambulatory Visit
Admission: RE | Admit: 2016-12-15 | Discharge: 2016-12-15 | Disposition: A | Discharge status: Home / Self Care | Payer: Medicare Other | Source: Ambulatory Visit | Attending: Internal Medicine | Admitting: Internal Medicine

## 2016-12-15 DIAGNOSIS — M19071 Primary osteoarthritis, right ankle and foot: Secondary | ICD-10-CM | POA: Diagnosis not present

## 2016-12-15 DIAGNOSIS — M79671 Pain in right foot: Secondary | ICD-10-CM

## 2016-12-16 DIAGNOSIS — N816 Rectocele: Secondary | ICD-10-CM | POA: Diagnosis not present

## 2016-12-16 DIAGNOSIS — N952 Postmenopausal atrophic vaginitis: Secondary | ICD-10-CM | POA: Diagnosis not present

## 2016-12-16 DIAGNOSIS — K5902 Outlet dysfunction constipation: Secondary | ICD-10-CM | POA: Diagnosis not present

## 2016-12-17 ENCOUNTER — Ambulatory Visit: Payer: Medicare Other | Admitting: Rheumatology

## 2016-12-28 ENCOUNTER — Ambulatory Visit (INDEPENDENT_AMBULATORY_CARE_PROVIDER_SITE_OTHER): Payer: Medicare Other | Admitting: Neurology

## 2016-12-28 ENCOUNTER — Encounter: Payer: Self-pay | Admitting: Neurology

## 2016-12-28 VITALS — BP 150/78 | HR 72 | Ht 61.0 in | Wt 160.0 lb

## 2016-12-28 DIAGNOSIS — M79671 Pain in right foot: Secondary | ICD-10-CM

## 2016-12-28 MED ORDER — MELOXICAM 15 MG PO TABS: 15.0000 mg | ORAL_TABLET | Freq: Every day | ORAL | 1 refills | Status: DC

## 2016-12-28 NOTE — Progress Notes (Signed)
Reason for visit: Right foot pain  Referring physician: Dr. Stephenie Acres Woodard-Hipps is a 71 y.o. female  History of present illness:  Rebecca Newton is a 71 year old right-handed white female with a history of onset of right foot pain around the beginning of May 2018. The patient had been walking barefoot on a ceramic floor throughout the weekend, she then developed significant pain in the right heel of the foot associated with weightbearing. She was given a trial on prednisone and Lyrica, but she could not tolerate the Lyrica. The patient has noted point tenderness on the lateral aspect of the heel, and when she stands on her toes the pain significant worsens. She has no discomfort when she is off of her feet, she is able to rest well at night, but as she bears weight in the morning, the pain ensues. The patient denies any numbness of the feet or any weakness of the legs. She denies back pain or pain down the legs and she denies any problems controlling the bowels or the bladder. She denies balance issues or problems with neck pain or arm discomfort. She is sent to this office for an evaluation. There is no problem with the left foot.  Past Medical History:  Diagnosis Date  . Fibromyalgia   . Interstitial cystitis   . Lichen sclerosus   . Osteopenia 12/2015   T score -1.2 FRAX 14%/1.6% stable from prior DEXA    Past Surgical History:  Procedure Laterality Date  . BILATERAL SALPINGOOPHORECTOMY    . COMBINED HYSTEROSCOPY DIAGNOSTIC / D&C  2004  . HYSTEROSCOPY    . LAPAROSCOPIC ASSISTED VAGINAL HYSTERECTOMY  2007   Leiomyoma/adenomyosis  . ROTATOR CUFF REPAIR  2006    Family History  Problem Relation Age of Onset  . Lung cancer Mother   . Heart disease Mother   . Stroke Father   . Diabetes Maternal Grandmother   . Heart disease Maternal Grandmother   . Breast cancer Sister        Age 54    Social history:  reports that she has never smoked. She has never used  smokeless tobacco. She reports that she drinks alcohol. She reports that she does not use drugs.  Medications:  Prior to Admission medications   Medication Sig Start Date End Date Taking? Authorizing Provider  amitriptyline (ELAVIL) 25 MG tablet Take 25 mg by mouth at bedtime. 12/16/16  Yes [provider]  clobetasol cream (TEMOVATE) 0.05 % Apply nightly 02/27/15  Yes Fontaine, Belinda Block, MD  conjugated estrogens (PREMARIN) vaginal cream Insert 0.5 gram in vagina 3 times a week. 04/12/12  Yes Gottsegen, Cherly Anderson, MD  diazepam (VALIUM) 10 MG tablet Take 1 tablet (10 mg total) by mouth every 6 (six) hours as needed for anxiety. 08/02/14  Yes Fontaine, Belinda Block, MD  hydrOXYzine (ATARAX/VISTARIL) 25 MG tablet Take 50 mg by mouth at bedtime.    Yes [provider]  lidocaine (XYLOCAINE) 2 % jelly 1 application as needed.   Yes [provider]  metFORMIN (GLUCOPHAGE) 500 MG tablet Take 0.5 tablets by mouth 2 (two) times daily. 09/24/16  Yes [provider]  rosuvastatin (CRESTOR) 20 MG tablet Take 20 mg by mouth daily.   Yes [provider]  zolpidem (AMBIEN) 10 MG tablet Take 10 mg by mouth at bedtime as needed.     Yes [provider]      Allergies  Allergen Reactions  . Macrodantin Hives  .  Lyrica [Pregabalin] Nausea Only    Dizziness  . Codeine Anxiety    ROS:  Out of a complete 14 system review of symptoms, the patient complains only of the following symptoms, and all other reviewed systems are negative.  Right foot pain  Blood pressure (!) 150/78, pulse 72, height 5\' 1"  (1.549 m), weight 160 lb (72.6 kg).  Physical Exam  General: The patient is alert and cooperative at the time of the examination.  Eyes: Pupils are equal, round, and reactive to light. Discs are flat bilaterally.  Neck: The neck is supple, no carotid bruits are noted.  Respiratory: The respiratory examination is clear.  Cardiovascular: The cardiovascular  examination reveals a regular rate and rhythm, no obvious murmurs or rubs are noted.  Skin: Extremities are without significant edema.  Neurologic Exam  Mental status: The patient is alert and oriented x 3 at the time of the examination. The patient has apparent normal recent and remote memory, with an apparently normal attention span and concentration ability.  Cranial nerves: Facial symmetry is present. There is good sensation of the face to pinprick and soft touch bilaterally. The strength of the facial muscles and the muscles to head turning and shoulder shrug are normal bilaterally. Speech is well enunciated, no aphasia or dysarthria is noted. Extraocular movements are full. Visual fields are full. The tongue is midline, and the patient has symmetric elevation of the soft palate. No obvious hearing deficits are noted.  Motor: The motor testing reveals 5 over 5 strength of all 4 extremities. Good symmetric motor tone is noted throughout.  Sensory: Sensory testing is intact to pinprick, soft touch, vibration sensation, and position sense on all 4 extremities. No evidence of extinction is noted.  Coordination: Cerebellar testing reveals good finger-nose-finger and heel-to-shin bilaterally.  Gait and station: Gait is normal. Tandem gait is normal. Romberg is negative. No drift is seen.  Reflexes: Deep tendon reflexes are symmetric, but are depressed bilaterally. Toes are downgoing bilaterally.   Assessment/Plan:  1. Mechanical source pain, right foot  The patient has pain around the right heel with weightbearing, she may have a heel spur or a tendinopathy or a plantar fasciitis. The patient will be placed on Mobic taking 15 mg daily over the next 2 months, and she will be referred to a podiatrist. She will follow-up through this office if needed.  Jill Alexanders MD 12/28/2016 3:37 PM  [facility] [address] [place], [place] [address]  Phone  [phone] Fax [phone]

## 2016-12-31 ENCOUNTER — Encounter: Payer: Self-pay | Admitting: Podiatry

## 2016-12-31 ENCOUNTER — Ambulatory Visit (INDEPENDENT_AMBULATORY_CARE_PROVIDER_SITE_OTHER): Payer: Medicare Other

## 2016-12-31 ENCOUNTER — Ambulatory Visit (INDEPENDENT_AMBULATORY_CARE_PROVIDER_SITE_OTHER): Payer: Medicare Other | Admitting: Podiatry

## 2016-12-31 VITALS — BP 110/62 | HR 70 | Resp 16 | Ht 62.0 in | Wt 155.0 lb

## 2016-12-31 DIAGNOSIS — M7661 Achilles tendinitis, right leg: Secondary | ICD-10-CM | POA: Diagnosis not present

## 2016-12-31 DIAGNOSIS — M722 Plantar fascial fibromatosis: Secondary | ICD-10-CM | POA: Diagnosis not present

## 2016-12-31 MED ORDER — TRIAMCINOLONE ACETONIDE 10 MG/ML IJ SUSP
10.0000 mg | Freq: Once | INTRAMUSCULAR | Status: AC
  Administered 2016-12-31: 10 mg

## 2016-12-31 NOTE — Progress Notes (Signed)
   Subjective:    Patient ID: Rebecca Newton, female    DOB: 1946-02-09, 71 y.o.   MRN: [id number]  HPI Chief Complaint  Patient presents with  . Foot Pain    Right foot; back of heel, bottom of heel & arch; pt stated, "Wants their doctor, Dr. Mertha Finders from [facility] [place] to get a copy of notes today; hurts more in the morning"; xSince May 2018      Review of Systems  Musculoskeletal: Positive for gait problem.  All other systems reviewed and are negative.      Objective:   Physical Exam        Assessment & Plan:

## 2016-12-31 NOTE — Patient Instructions (Signed)

## 2016-12-31 NOTE — Progress Notes (Signed)
Subjective:    Patient ID: Rebecca Newton, female   DOB: 71 y.o.   MRN: [id number]   HPI patient presents with 6 week history of severe discomfort in the back and bottom of the right heel. She states she thinks the pain started in the back but now appears to be mostly in the bottom of the heel and it's worse when getting up in the morning and after periods of sitting    Review of Systems  All other systems reviewed and are negative.       Objective:  Physical Exam  Cardiovascular: Intact distal pulses.   Musculoskeletal: Normal range of motion.  Neurological: She is alert.  Skin: Skin is warm.  Nursing note and vitals reviewed. neurovascular status found to be intact with muscle strength adequate range of motion within normal limits. Patient was found to have exquisite discomfort plantar aspect right heel at the insertional point tendon into the calcaneus with posterior pain that's minimal in intensity with no increased erythema edema or other pathology. Patient's found have good digital perfusion is well oriented 3     Assessment:    Acute plantar fasciitis with the possibility for Achilles tendinitis right     Plan:    H&P conditions reviewed x-rays reviewed with patient. Today I injected the plantar right heel 3 mg Kenalog 5 mill grams Xylocaine and applied fascial brace with instructions on usage. I will see back in 2 weeks to reevaluate  X-rays indicate that there is posterior spur formation with minimal plantar spur formation

## 2017-01-06 ENCOUNTER — Ambulatory Visit: Payer: Medicare Other | Admitting: Neurology

## 2017-01-07 DIAGNOSIS — L718 Other rosacea: Secondary | ICD-10-CM | POA: Diagnosis not present

## 2017-01-08 ENCOUNTER — Ambulatory Visit: Payer: Medicare Other | Admitting: Podiatry

## 2017-01-11 ENCOUNTER — Ambulatory Visit: Payer: Medicare Other | Admitting: Podiatry

## 2017-01-14 ENCOUNTER — Ambulatory Visit: Payer: Medicare Other | Admitting: Rheumatology

## 2017-01-14 ENCOUNTER — Encounter: Payer: Self-pay | Admitting: Podiatry

## 2017-01-14 ENCOUNTER — Ambulatory Visit (INDEPENDENT_AMBULATORY_CARE_PROVIDER_SITE_OTHER): Payer: Medicare Other | Admitting: Podiatry

## 2017-01-14 DIAGNOSIS — M722 Plantar fascial fibromatosis: Secondary | ICD-10-CM

## 2017-01-14 MED ORDER — TRIAMCINOLONE ACETONIDE 10 MG/ML IJ SUSP
10.0000 mg | Freq: Once | INTRAMUSCULAR | Status: AC
  Administered 2017-01-14: 10 mg

## 2017-01-14 NOTE — Progress Notes (Signed)
Subjective:    Patient ID: Rebecca Newton, female   DOB: 71 y.o.   MRN: [id number]   HPI patient states the inside of the right heel is feeling pretty good outside has been bothering her quite a bit and making it hard to bear weight with some pain in the back    ROS      Objective:  Physical Exam neurovascular status intact with discomfort in the plantar lateral aspect of the right heel with fluid buildup noted and mild discomfort within the lateral Achilles tendon area     Assessment:   Plantar fasciitis lateral right side with inflammation pain present      Plan:    Coming in from the lateral side I did inject the plantar fascia 3 mg Kenalog 5 mg Xylocaine and instructed on physical therapy. Reappoint to recheck

## 2017-02-01 NOTE — Progress Notes (Signed)
Office Visit Note  Patient: Rebecca Newton             Date of Birth: 03-11-1946           MRN: [id number]             PCP: Josetta Huddle, MD Referring: Josetta Huddle, MD Visit Date: 02/02/2017 Occupation: @GUAROCC @    Subjective:  No chief complaint on file.   History of Present Illness: Rebecca Newton is a 71 y.o. female  Last seen in our office on 09/03/2016 for fibromyalgia. On that visit, she rated her discomfort as 8-9 on a scale of 0-10. She also was having ongoing fatigue and insomnia.   In addition, she was having ongoing bilateral knee pain, neck pain from degenerative disc disease, feet pain from osteoarthritis of the feet. X-rays done at the last visit shows moderate osteoarthritis of bilateral knees. We discussed Visco supplementation with the patient at the proper time and informed the patient that she needs to let us know when she is ready for Visco supplementation. She is aware that she needs to do the Visco after she has failed cortisone injection.  Today, FMS is a litle better.  Pain is about "5-7" Right foot pain at heel and ankle (seen by foot doctor) and got 2 shots of cortisone. Pain to right foot is about 7 - 8. Dx/d w/ plantar fascitis. Both knees are doing well.    Activities of Daily Living:  Patient reports morning stiffness for 30 minutes.   Patient Denies nocturnal pain.  Difficulty dressing/grooming: Denies Difficulty climbing stairs: Denies Difficulty getting out of chair: Denies Difficulty using hands for taps, buttons, cutlery, and/or writing: Denies   Review of Systems  Constitutional: Positive for fatigue.  HENT: Negative for mouth sores and mouth dryness.   Eyes: Negative for dryness.  Respiratory: Negative for shortness of breath.   Gastrointestinal: Negative for constipation and diarrhea.  Musculoskeletal: Positive for myalgias and myalgias.  Skin: Negative for sensitivity to sunlight.  Psychiatric/Behavioral:  Positive for sleep disturbance. Negative for decreased concentration.    PMFS History:  Patient Active Problem List   Diagnosis Date Noted  . 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 Osteopenia   . Interstitial cystitis   . Fibromyalgia     Past Medical History:  Diagnosis Date  . Fibromyalgia   . Interstitial cystitis   . Lichen sclerosus   . Osteopenia 12/2015   T score -1.2 FRAX 14%/1.6% stable from prior DEXA    Family History  Problem Relation Age of Onset  . Lung cancer Mother   . Heart disease Mother   . Stroke Father   . Diabetes Maternal Grandmother   . Heart disease Maternal Grandmother   . Breast cancer Sister        Age 75   Past Surgical History:  Procedure Laterality Date  . BILATERAL SALPINGOOPHORECTOMY    . COMBINED HYSTEROSCOPY DIAGNOSTIC / D&C  2004  . HYSTEROSCOPY    . LAPAROSCOPIC ASSISTED VAGINAL HYSTERECTOMY  2007   Leiomyoma/adenomyosis  . ROTATOR CUFF REPAIR  2006   Social History   Social History Narrative   Lives with spouse   Caffeine use: Drinks decaf passion tea   No soda   Right handed     Objective: Vital Signs: BP (!) 148/68   Pulse 74   Resp 14   Ht 5\' 2"  (1.575 m)   Wt 155 lb (70.3 kg)   BMI 28.35 kg/m    Physical  Exam  Constitutional: She is oriented to person, place, and time. She appears well-developed and well-nourished.  HENT:  Head: Normocephalic and atraumatic.  Eyes: EOM are normal. Pupils are equal, round, and reactive to light.  Cardiovascular: Normal rate, regular rhythm and normal heart sounds.  Exam reveals no gallop and no friction rub.   No murmur heard. Pulmonary/Chest: Effort normal and breath sounds normal. She has no wheezes. She has no rales.  Abdominal: Soft. Bowel sounds are normal. She exhibits no distension. There is no tenderness. There is no guarding. No hernia.  Musculoskeletal: Normal range of motion. She exhibits no edema, tenderness or deformity.  Lymphadenopathy:    She has no cervical  adenopathy.  Neurological: She is alert and oriented to person, place, and time. Coordination normal.  Skin: Skin is warm and dry. Capillary refill takes less than 2 seconds. No rash noted.  Psychiatric: She has a normal mood and affect. Her behavior is normal.  Nursing note and vitals reviewed.    Musculoskeletal Exam:  Full range of motion of all joints Grip strength is equal and strong bilaterally Fiber myalgia tender points are 18 out of 18 positive  CDAI Exam: CDAI Homunculus Exam:   Joint Counts:  CDAI Tender Joint count: 0 CDAI Swollen Joint count: 0  Global Assessments:  Patient Global Assessment: 7 Provider Global Assessment: 7  CDAI Calculated Score: 14  No synovitis on examination Patient's pain is about 5-7 on a scale of 0-10 from her fibromyalgia.  Investigation: No additional findings. No visits with results within 6 Month(s) from this visit.  Latest known visit with results is:  Office Visit on 02/27/2015  Component Date Value Ref Range Status  . Yeast Wet Prep HPF POC 02/27/2015 NONE SEEN  NONE SEEN Final  . Trich, Wet Prep 02/27/2015 NONE SEEN  NONE SEEN Final  . Clue Cells Wet Prep HPF POC 02/27/2015 NONE SEEN  NONE SEEN Final  . WBC, Wet Prep HPF POC 02/27/2015 RARE  NONE SEEN Final   Comment: RARE BACTERIA SEEN (7-12) EPITH. CELLS PER HPF AMINE NEGATIVE      Imaging: No results found.  Speciality Comments: No specialty comments available.    Procedures:  No procedures performed Allergies: Macrodantin; Ciprofloxacin; Lyrica [pregabalin]; and Codeine   Assessment / Plan:     Visit Diagnoses: Fibromyalgia  Primary insomnia  Fatigue, unspecified type  Bilateral chronic knee pain  Osteopenia, unspecified location    Plan: #1: Fibromyalgia. Active disease with generalized pain and 18 out of 18 tender points. Patient has been encouraged to continue to do activities that'll exacerbate her fibromyalgia.  #2: Fatigue and insomnia.  Ongoing.   #3: Osteoarthritis of bilateral knees. She does not need any Visco supplementation at this time but she is aware that that option is available if she needs it in the future. She knows that she needs to first get a cortisone injection, continue weight loss program, use NSAIDs if appropriate. Once those fail and she feels that the pain is unbearable getting worse and she would like intervention, she can apply for Visco supplementation. At the last visit, we did x-rays of both knees which showed moderate osteoarthritis of bilateral knees.  #4: She Has labs done at Dr. Inda Merlin office. She will get [place] a copy of those labs to put in the records and she had some done recently. She has had a history of low vitamin D and she'll coordinate with Dr. Inda Merlin to get a vitamin D update.  #5:  Bone density done by PCP Dr. Phineas Real. Patient had it done last year. Patient states that Dr. Phineas Real reported that her bone densities the same as before and she still osteopenic but she does not need any medication treatment at this time  #6: Return to clinic in 6 months.    Orders: No orders of the defined types were placed in this encounter.  No orders of the defined types were placed in this encounter.   Face-to-face time spent with patient was 30 minutes. 50% of time was spent in counseling and coordination of care.  Follow-Up Instructions: Return in about 6 months (around 08/05/2017) for [place], [place].   Eliezer Lofts, PA-C  Note - This record has been created using [employer].  Chart creation errors have been sought, but may not always  have been located. Such creation errors do not reflect on  the standard of medical care.

## 2017-02-02 ENCOUNTER — Encounter: Payer: Self-pay | Admitting: Rheumatology

## 2017-02-02 ENCOUNTER — Ambulatory Visit (INDEPENDENT_AMBULATORY_CARE_PROVIDER_SITE_OTHER): Payer: Medicare Other | Admitting: Rheumatology

## 2017-02-02 VITALS — BP 148/68 | HR 74 | Resp 14 | Ht 62.0 in | Wt 155.0 lb

## 2017-02-02 DIAGNOSIS — M25561 Pain in right knee: Secondary | ICD-10-CM | POA: Diagnosis not present

## 2017-02-02 DIAGNOSIS — F5101 Primary insomnia: Secondary | ICD-10-CM | POA: Diagnosis not present

## 2017-02-02 DIAGNOSIS — M8580 Other specified disorders of bone density and structure, unspecified site: Secondary | ICD-10-CM

## 2017-02-02 DIAGNOSIS — R5383 Other fatigue: Secondary | ICD-10-CM

## 2017-02-02 DIAGNOSIS — M25562 Pain in left knee: Secondary | ICD-10-CM | POA: Diagnosis not present

## 2017-02-02 DIAGNOSIS — G8929 Other chronic pain: Secondary | ICD-10-CM

## 2017-02-02 DIAGNOSIS — M797 Fibromyalgia: Secondary | ICD-10-CM

## 2017-02-18 DIAGNOSIS — G4700 Insomnia, unspecified: Secondary | ICD-10-CM | POA: Diagnosis not present

## 2017-02-18 DIAGNOSIS — F411 Generalized anxiety disorder: Secondary | ICD-10-CM | POA: Diagnosis not present

## 2017-02-18 DIAGNOSIS — F439 Reaction to severe stress, unspecified: Secondary | ICD-10-CM | POA: Diagnosis not present

## 2017-02-18 DIAGNOSIS — R030 Elevated blood-pressure reading, without diagnosis of hypertension: Secondary | ICD-10-CM | POA: Diagnosis not present

## 2017-02-18 DIAGNOSIS — R0789 Other chest pain: Secondary | ICD-10-CM | POA: Diagnosis not present

## 2017-04-06 DIAGNOSIS — H01021 Squamous blepharitis right upper eyelid: Secondary | ICD-10-CM | POA: Diagnosis not present

## 2017-04-06 DIAGNOSIS — H01024 Squamous blepharitis left upper eyelid: Secondary | ICD-10-CM | POA: Diagnosis not present

## 2017-04-06 DIAGNOSIS — H01025 Squamous blepharitis left lower eyelid: Secondary | ICD-10-CM | POA: Diagnosis not present

## 2017-04-06 DIAGNOSIS — H01022 Squamous blepharitis right lower eyelid: Secondary | ICD-10-CM | POA: Diagnosis not present

## 2017-04-06 DIAGNOSIS — Z961 Presence of intraocular lens: Secondary | ICD-10-CM | POA: Diagnosis not present

## 2017-04-06 DIAGNOSIS — H16223 Keratoconjunctivitis sicca, not specified as Sjogren's, bilateral: Secondary | ICD-10-CM | POA: Diagnosis not present

## 2017-04-06 DIAGNOSIS — H43821 Vitreomacular adhesion, right eye: Secondary | ICD-10-CM | POA: Diagnosis not present

## 2017-04-06 DIAGNOSIS — H40013 Open angle with borderline findings, low risk, bilateral: Secondary | ICD-10-CM | POA: Diagnosis not present

## 2017-04-21 ENCOUNTER — Other Ambulatory Visit: Payer: Self-pay | Admitting: Podiatry

## 2017-04-21 ENCOUNTER — Ambulatory Visit (INDEPENDENT_AMBULATORY_CARE_PROVIDER_SITE_OTHER): Payer: Medicare Other

## 2017-04-21 ENCOUNTER — Ambulatory Visit (INDEPENDENT_AMBULATORY_CARE_PROVIDER_SITE_OTHER): Payer: Medicare Other | Admitting: Podiatry

## 2017-04-21 ENCOUNTER — Encounter: Payer: Self-pay | Admitting: Podiatry

## 2017-04-21 DIAGNOSIS — M7751 Other enthesopathy of right foot: Secondary | ICD-10-CM | POA: Diagnosis not present

## 2017-04-21 DIAGNOSIS — M779 Enthesopathy, unspecified: Secondary | ICD-10-CM

## 2017-04-21 DIAGNOSIS — M79671 Pain in right foot: Secondary | ICD-10-CM | POA: Diagnosis not present

## 2017-04-21 MED ORDER — TRIAMCINOLONE ACETONIDE 10 MG/ML IJ SUSP
10.0000 mg | Freq: Once | INTRAMUSCULAR | Status: AC
  Administered 2017-04-21: 10 mg

## 2017-04-22 NOTE — Progress Notes (Signed)
Subjective:    Patient ID: Rebecca Newton, female   DOB: 71 y.o.   MRN: [id number]   HPI patient presents stating she's developed a lot of pain in the right ankle and she does not remember specific injury and it's been present for several months    ROS      Objective:  Physical Exam neurovascular status intact with inflammation noted sinus tarsi right slightly distal to this and into the lateral ankle gutter with the worst area be in the sinus tarsi     Assessment:   Inflammatory capsulitis of the sinus tarsi right      Plan:   H&P condition reviewed and injected the sinus tarsi 3 mg Kenalog 5 mg Xylocaine and advised on physical therapy anti-inflammatories. Reappoint to recheck to see response and x-ray of the weeks  X-ray was negative for signs of stress fracture or advanced arthritis

## 2017-06-04 ENCOUNTER — Ambulatory Visit
Admission: RE | Admit: 2017-06-04 | Discharge: 2017-06-04 | Disposition: A | Discharge status: Home / Self Care | Payer: Medicare Other | Source: Ambulatory Visit | Attending: Internal Medicine | Admitting: Internal Medicine

## 2017-06-04 DIAGNOSIS — Z1231 Encounter for screening mammogram for malignant neoplasm of breast: Secondary | ICD-10-CM | POA: Diagnosis not present

## 2017-06-07 ENCOUNTER — Telehealth: Payer: Self-pay | Admitting: *Deleted

## 2017-06-07 NOTE — Telephone Encounter (Signed)
Pt informed normal mammogram results on 06/04/17

## 2017-06-22 DIAGNOSIS — R7303 Prediabetes: Secondary | ICD-10-CM | POA: Diagnosis not present

## 2017-06-22 DIAGNOSIS — Z79899 Other long term (current) drug therapy: Secondary | ICD-10-CM | POA: Diagnosis not present

## 2017-06-22 DIAGNOSIS — R030 Elevated blood-pressure reading, without diagnosis of hypertension: Secondary | ICD-10-CM | POA: Diagnosis not present

## 2017-06-22 DIAGNOSIS — E782 Mixed hyperlipidemia: Secondary | ICD-10-CM | POA: Diagnosis not present

## 2017-06-22 DIAGNOSIS — R202 Paresthesia of skin: Secondary | ICD-10-CM | POA: Diagnosis not present

## 2017-06-22 DIAGNOSIS — E559 Vitamin D deficiency, unspecified: Secondary | ICD-10-CM | POA: Diagnosis not present

## 2017-06-23 DIAGNOSIS — Z0000 Encounter for general adult medical examination without abnormal findings: Secondary | ICD-10-CM | POA: Diagnosis not present

## 2017-06-23 DIAGNOSIS — R7303 Prediabetes: Secondary | ICD-10-CM | POA: Diagnosis not present

## 2017-06-23 DIAGNOSIS — R030 Elevated blood-pressure reading, without diagnosis of hypertension: Secondary | ICD-10-CM | POA: Diagnosis not present

## 2017-06-23 DIAGNOSIS — R202 Paresthesia of skin: Secondary | ICD-10-CM | POA: Diagnosis not present

## 2017-06-23 DIAGNOSIS — B373 Candidiasis of vulva and vagina: Secondary | ICD-10-CM | POA: Diagnosis not present

## 2017-06-23 DIAGNOSIS — E559 Vitamin D deficiency, unspecified: Secondary | ICD-10-CM | POA: Diagnosis not present

## 2017-06-23 DIAGNOSIS — E782 Mixed hyperlipidemia: Secondary | ICD-10-CM | POA: Diagnosis not present

## 2017-06-23 DIAGNOSIS — Z23 Encounter for immunization: Secondary | ICD-10-CM | POA: Diagnosis not present

## 2017-06-23 DIAGNOSIS — Z79899 Other long term (current) drug therapy: Secondary | ICD-10-CM | POA: Diagnosis not present

## 2017-06-23 DIAGNOSIS — L259 Unspecified contact dermatitis, unspecified cause: Secondary | ICD-10-CM | POA: Diagnosis not present

## 2017-06-23 DIAGNOSIS — Z1389 Encounter for screening for other disorder: Secondary | ICD-10-CM | POA: Diagnosis not present

## 2017-06-25 DIAGNOSIS — J45991 Cough variant asthma: Secondary | ICD-10-CM | POA: Diagnosis not present

## 2017-06-25 DIAGNOSIS — E782 Mixed hyperlipidemia: Secondary | ICD-10-CM | POA: Diagnosis not present

## 2017-06-25 DIAGNOSIS — E785 Hyperlipidemia, unspecified: Secondary | ICD-10-CM | POA: Diagnosis not present

## 2017-07-27 NOTE — Progress Notes (Signed)
Office Visit Note  Patient: Rebecca Newton             Date of Birth: 03/01/1946           MRN: [id number]             PCP: Josetta Huddle, MD Referring: Josetta Huddle, MD Visit Date: 08/10/2017 Occupation: @GUAROCC @    Subjective:  Bilateral knee pain   History of Present Illness: Rebecca Newton is a 72 y.o. female with history of fibromyalgia and DDD of the spine.  She states that her fibromyalgia has been well controlled.  She has been walking regularly.  She denies any joint pain or swelling at this time.  She states her bilateral knees occasionally cause discomfort but have not been swelling.  She states she experiences discomfort when kneeling. She states her neck has not been causing her discomfort but she does have stiffness.  She states that her insomnia has improved with Ambien 10 mg at bedtime.  She ranks her fibromyalgia pain a 6 out of 10.  She states she occasionally has a rash that flares up that is managed by her PCP and she takes Benadryl and it resolves.  Her plantar fasciitis has resolved by wearing proper fitting shoes.     Activities of Daily Living:  Patient reports morning stiffness for 0  minutes.   Patient Denies nocturnal pain.  Difficulty dressing/grooming: Denies Difficulty climbing stairs: Denies Difficulty getting out of chair: Denies Difficulty using hands for taps, buttons, cutlery, and/or writing: Denies   Review of Systems  Constitutional: Negative for fatigue and weakness.  HENT: Negative for mouth sores, mouth dryness and nose dryness.   Eyes: Negative for dryness.  Respiratory: Negative for cough, hemoptysis and shortness of breath.   Cardiovascular: Negative for chest pain, palpitations, hypertension, irregular heartbeat and swelling in legs/feet.  Gastrointestinal: Positive for constipation. Negative for blood in stool and diarrhea.  Endocrine: Negative for increased urination.  Genitourinary: Negative for painful urination.    Musculoskeletal: Positive for muscle tenderness. Negative for arthralgias, joint pain, joint swelling, myalgias, muscle weakness, morning stiffness and myalgias.  Skin: Positive for rash. Negative for pallor, hair loss, nodules/bumps, redness, skin tightness, ulcers and sensitivity to sunlight.  Neurological: Negative for dizziness, numbness and headaches.  Hematological: Negative for swollen glands.  Psychiatric/Behavioral: Positive for sleep disturbance. Negative for depressed mood. The patient is not nervous/anxious.     PMFS History:  Patient Active Problem List   Diagnosis Date Noted  . Primary osteoarthritis of both knees 08/10/2017  . 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 Osteopenia   . Interstitial cystitis   . Fibromyalgia     Past Medical History:  Diagnosis Date  . Fibromyalgia   . Interstitial cystitis   . Lichen sclerosus   . Osteopenia 12/2015   T score -1.2 FRAX 14%/1.6% stable from prior DEXA    Family History  Problem Relation Age of Onset  . Lung cancer Mother   . Heart disease Mother   . Stroke Father   . Diabetes Maternal Grandmother   . Heart disease Maternal Grandmother   . Breast cancer Sister        Age 87   Past Surgical History:  Procedure Laterality Date  . BILATERAL SALPINGOOPHORECTOMY    . COMBINED HYSTEROSCOPY DIAGNOSTIC / D&C  2004  . HYSTEROSCOPY    . LAPAROSCOPIC ASSISTED VAGINAL HYSTERECTOMY  2007   Leiomyoma/adenomyosis  . ROTATOR CUFF REPAIR  2006   Social History  Social History Narrative   Lives with spouse   Caffeine use: Drinks decaf passion tea   No soda   Right handed     Objective: Vital Signs: BP 124/68 (BP Location: Left Arm, Patient Position: Sitting, Cuff Size: Normal)   Pulse 71   Resp 16   Ht 5\' 2"  (1.575 m)   Wt 148 lb (67.1 kg)   BMI 27.07 kg/m    Physical Exam  Constitutional: She is oriented to person, place, and time. She appears well-developed and well-nourished.  HENT:  Head: Normocephalic and  atraumatic.  Eyes: Conjunctivae and EOM are normal.  Neck: Normal range of motion.  Cardiovascular: Normal rate, regular rhythm, normal heart sounds and intact distal pulses.  Pulmonary/Chest: Effort normal and breath sounds normal.  Abdominal: Soft. Bowel sounds are normal.  Lymphadenopathy:    She has no cervical adenopathy.  Neurological: She is alert and oriented to person, place, and time.  Skin: Skin is warm and dry. Capillary refill takes less than 2 seconds.  Psychiatric: She has a normal mood and affect. Her behavior is normal.  Nursing note and vitals reviewed.    Musculoskeletal Exam: C-spine limited ROM.  Thoracic and lumbar good ROM.  18 out of 18 tender points.  No SI joint pain.  Shoulder joints, elbow joints, wrist joints, MCPs, PIPs, and DIPs good ROM with no synovitis.  Left hip slightly limited ROM with no discomfort.  Right hip, knees, ankles, MTPs, PIPs, and DIPs good ROM with no synovitis.  Mild tenderness of right trochanteric bursa.      CDAI Exam: No CDAI exam completed.    Investigation: No additional findings.   Imaging: No results found.  Speciality Comments: No specialty comments available.    Procedures:  No procedures performed Allergies: Macrodantin; Ciprofloxacin; Lyrica [pregabalin]; and Codeine   Assessment / Plan:     Visit Diagnoses: Fibromyalgia: She has 18 out of 18 tender points on exam.  She is overall doing well.  She feels her fibromyalgia pain has improved.  She walks for exercise on a regular basis.  Insomnia and fatigue have improved.    Primary insomnia: Improved with Ambien 10 mg at bedtime.  She does not need any refills at this time.    Other fatigue: Improved due to better sleep at night.    Primary osteoarthritis of both knees: She has crepitus of her left knee.  No warmth or swelling.  Full ROM.  She has occasional discomfort when rising from a kneeling position.  She had bilateral knee X-rays performed on 09/03/16 that  revealed moderate osteoarthritis of bilateral knees.   Lateral epicondylitis, right elbow: Resolved.    DDD (degenerative disc disease), cervical: She has limited C-spine lateral rotation.  She has no discomfort at this time.    Osteopenia of multiple sites: Her bone densities are monitored by her PCP.   Other medical conditions are listed below:  Interstitial cystitis  History of IBS  History of migraine    Orders: No orders of the defined types were placed in this encounter.  No orders of the defined types were placed in this encounter.   Face-to-face time spent with patient was 30 minutes. Greater than 50% of time was spent in counseling and coordination of care.  Follow-Up Instructions: Return in about 6 months (around 02/07/2018) for Osteoarthritis.   Bo Merino, MD  Note - This record has been created using [employer].  Chart creation errors have been sought, but may not always  have been located. Such creation errors do not reflect on  the standard of medical care.

## 2017-08-10 ENCOUNTER — Encounter: Payer: Self-pay | Admitting: Rheumatology

## 2017-08-10 ENCOUNTER — Ambulatory Visit (INDEPENDENT_AMBULATORY_CARE_PROVIDER_SITE_OTHER): Payer: Medicare Other | Admitting: Rheumatology

## 2017-08-10 VITALS — BP 124/68 | HR 71 | Resp 16 | Ht 62.0 in | Wt 148.0 lb

## 2017-08-10 DIAGNOSIS — M5030 Other cervical disc degeneration, unspecified cervical region: Secondary | ICD-10-CM | POA: Diagnosis not present

## 2017-08-10 DIAGNOSIS — N3010 Interstitial cystitis (chronic) without hematuria: Secondary | ICD-10-CM

## 2017-08-10 DIAGNOSIS — Z8669 Personal history of other diseases of the nervous system and sense organs: Secondary | ICD-10-CM

## 2017-08-10 DIAGNOSIS — M170 Bilateral primary osteoarthritis of knee: Secondary | ICD-10-CM | POA: Diagnosis not present

## 2017-08-10 DIAGNOSIS — F5101 Primary insomnia: Secondary | ICD-10-CM

## 2017-08-10 DIAGNOSIS — M7711 Lateral epicondylitis, right elbow: Secondary | ICD-10-CM | POA: Diagnosis not present

## 2017-08-10 DIAGNOSIS — Z8719 Personal history of other diseases of the digestive system: Secondary | ICD-10-CM

## 2017-08-10 DIAGNOSIS — M8589 Other specified disorders of bone density and structure, multiple sites: Secondary | ICD-10-CM | POA: Diagnosis not present

## 2017-08-10 DIAGNOSIS — R5383 Other fatigue: Secondary | ICD-10-CM

## 2017-08-10 DIAGNOSIS — M797 Fibromyalgia: Secondary | ICD-10-CM | POA: Diagnosis not present

## 2017-08-12 ENCOUNTER — Ambulatory Visit
Admission: RE | Admit: 2017-08-12 | Discharge: 2017-08-12 | Disposition: A | Discharge status: Home / Self Care | Payer: Medicare Other | Source: Ambulatory Visit | Attending: Internal Medicine | Admitting: Internal Medicine

## 2017-08-12 ENCOUNTER — Other Ambulatory Visit: Payer: Self-pay | Admitting: Internal Medicine

## 2017-08-12 DIAGNOSIS — R51 Headache: Secondary | ICD-10-CM | POA: Diagnosis not present

## 2017-08-12 DIAGNOSIS — R519 Headache, unspecified: Secondary | ICD-10-CM

## 2017-08-12 DIAGNOSIS — S0990XA Unspecified injury of head, initial encounter: Secondary | ICD-10-CM

## 2017-08-30 ENCOUNTER — Ambulatory Visit (INDEPENDENT_AMBULATORY_CARE_PROVIDER_SITE_OTHER): Payer: Medicare Other | Admitting: Gynecology

## 2017-08-30 ENCOUNTER — Encounter: Payer: Self-pay | Admitting: Gynecology

## 2017-08-30 VITALS — BP 120/70 | Ht 62.0 in | Wt 142.0 lb

## 2017-08-30 DIAGNOSIS — M8580 Other specified disorders of bone density and structure, unspecified site: Secondary | ICD-10-CM

## 2017-08-30 DIAGNOSIS — Z01411 Encounter for gynecological examination (general) (routine) with abnormal findings: Secondary | ICD-10-CM

## 2017-08-30 DIAGNOSIS — N816 Rectocele: Secondary | ICD-10-CM

## 2017-08-30 DIAGNOSIS — N952 Postmenopausal atrophic vaginitis: Secondary | ICD-10-CM

## 2017-08-30 DIAGNOSIS — Z1272 Encounter for screening for malignant neoplasm of vagina: Secondary | ICD-10-CM

## 2017-08-30 NOTE — Progress Notes (Signed)
    Rebecca Newton 1945-10-16 [id number]        71 y.o.  G1P1001 for breast and pelvic exam.  Past medical history,surgical history, problem list, medications, allergies, family history and social history were all reviewed and documented as reviewed in the EPIC chart.  ROS:  Performed with pertinent positives and negatives included in the history, assessment and plan.   Additional significant findings : None   Exam: Caryn Bee assistant Vitals:   08/30/17 1355  BP: 120/70  Weight: 142 lb (64.4 kg)  Height: 5\' 2"  (1.575 m)   Body mass index is 25.97 kg/m.  General appearance:  Normal affect, orientation and appearance. Skin: Grossly normal HEENT: Without gross lesions.  No cervical or supraclavicular adenopathy. Thyroid normal.  Lungs:  Clear without wheezing, rales or rhonchi Cardiac: RR, without RMG Abdominal:  Soft, nontender, without masses, guarding, rebound, organomegaly or hernia Breasts:  Examined lying and sitting without masses, retractions, discharge or axillary adenopathy. Pelvic:  Ext, BUS, Vagina: With atrophic changes.  Rectocele noted.  Cuff well supported.  Mild cystocele noted.  Adnexa: Without masses or tenderness    Anus and perineum: Normal   Rectovaginal: Normal sphincter tone without palpated masses or tenderness.    Assessment/Plan:  72 y.o. G74P1001 female for breast and pelvic exam.  Status post LAVH BSO 2007 for adenomyosis and leiomyoma..   1. Postmenopausal/atrophic genital changes.  No significant hot flushes, night sweats or vaginal dryness. 2. Rectocele.  Was evaluated by Dr. [place] Pink.  Does not seem to be a big issue with her at this point.  She is not having significant symptoms at this time.  She will follow-up with Dr. Zigmund Daniel if symptoms develop and she wants to consider surgery. 3. Osteopenia.  DEXA 12/2015.  T score -1.2 FRAX 14% / 1.6%.  Plan repeat DEXA in another year or 2. 4. Mammography 05/2017.  Continue with  annual mammography when due.  Breast exam normal today. 5. Colonoscopy 2012.  Repeat at their recommended interval. 6. Pap smear 2016.  Pap smear done today.  History of cryosurgery over 30 years ago.  Options to stop screening altogether versus less frequent screening intervals reviewed.  Will readdress on an annual basis. 7. Health maintenance.  No routine lab work done as patient does this elsewhere.  Follow-up 1 year, sooner as needed.   Anastasio Auerbach MD, 2:28 PM 08/30/2017

## 2017-08-30 NOTE — Addendum Note (Signed)
Addended by: Nelva Nay on: 08/30/2017 02:57 PM   Modules accepted: Orders

## 2017-08-30 NOTE — Patient Instructions (Signed)
Follow-up in 1 year, sooner as needed. 

## 2017-09-03 LAB — PAP IG W/ RFLX HPV ASCU

## 2017-09-08 DIAGNOSIS — L738 Other specified follicular disorders: Secondary | ICD-10-CM | POA: Diagnosis not present

## 2017-09-08 DIAGNOSIS — D485 Neoplasm of uncertain behavior of skin: Secondary | ICD-10-CM | POA: Diagnosis not present

## 2017-09-08 DIAGNOSIS — L570 Actinic keratosis: Secondary | ICD-10-CM | POA: Diagnosis not present

## 2017-09-14 DIAGNOSIS — R51 Headache: Secondary | ICD-10-CM | POA: Diagnosis not present

## 2017-10-19 DIAGNOSIS — G43909 Migraine, unspecified, not intractable, without status migrainosus: Secondary | ICD-10-CM | POA: Diagnosis not present

## 2017-11-16 DIAGNOSIS — M797 Fibromyalgia: Secondary | ICD-10-CM | POA: Diagnosis not present

## 2017-11-16 DIAGNOSIS — Z87440 Personal history of urinary (tract) infections: Secondary | ICD-10-CM | POA: Diagnosis not present

## 2017-11-16 DIAGNOSIS — N94819 Vulvodynia, unspecified: Secondary | ICD-10-CM | POA: Diagnosis not present

## 2017-11-16 DIAGNOSIS — R3915 Urgency of urination: Secondary | ICD-10-CM | POA: Diagnosis not present

## 2017-11-16 DIAGNOSIS — N9489 Other specified conditions associated with female genital organs and menstrual cycle: Secondary | ICD-10-CM | POA: Diagnosis not present

## 2017-11-16 DIAGNOSIS — K582 Mixed irritable bowel syndrome: Secondary | ICD-10-CM | POA: Diagnosis not present

## 2017-11-16 DIAGNOSIS — R5382 Chronic fatigue, unspecified: Secondary | ICD-10-CM | POA: Diagnosis not present

## 2017-11-16 DIAGNOSIS — N3010 Interstitial cystitis (chronic) without hematuria: Secondary | ICD-10-CM | POA: Diagnosis not present

## 2017-11-16 DIAGNOSIS — R350 Frequency of micturition: Secondary | ICD-10-CM | POA: Diagnosis not present

## 2017-11-16 DIAGNOSIS — R82998 Other abnormal findings in urine: Secondary | ICD-10-CM | POA: Diagnosis not present

## 2017-11-17 ENCOUNTER — Other Ambulatory Visit: Payer: Self-pay | Admitting: Internal Medicine

## 2017-11-17 ENCOUNTER — Other Ambulatory Visit: Payer: Self-pay | Admitting: Gynecology

## 2017-11-17 DIAGNOSIS — Z1231 Encounter for screening mammogram for malignant neoplasm of breast: Secondary | ICD-10-CM

## 2017-12-27 DIAGNOSIS — G4700 Insomnia, unspecified: Secondary | ICD-10-CM | POA: Diagnosis not present

## 2017-12-27 DIAGNOSIS — E782 Mixed hyperlipidemia: Secondary | ICD-10-CM | POA: Diagnosis not present

## 2017-12-27 DIAGNOSIS — R7303 Prediabetes: Secondary | ICD-10-CM | POA: Diagnosis not present

## 2017-12-27 DIAGNOSIS — Z7984 Long term (current) use of oral hypoglycemic drugs: Secondary | ICD-10-CM | POA: Diagnosis not present

## 2017-12-27 DIAGNOSIS — N816 Rectocele: Secondary | ICD-10-CM | POA: Diagnosis not present

## 2017-12-27 DIAGNOSIS — K521 Toxic gastroenteritis and colitis: Secondary | ICD-10-CM | POA: Diagnosis not present

## 2017-12-27 DIAGNOSIS — G43009 Migraine without aura, not intractable, without status migrainosus: Secondary | ICD-10-CM | POA: Diagnosis not present

## 2017-12-27 DIAGNOSIS — F411 Generalized anxiety disorder: Secondary | ICD-10-CM | POA: Diagnosis not present

## 2017-12-29 DIAGNOSIS — N3010 Interstitial cystitis (chronic) without hematuria: Secondary | ICD-10-CM | POA: Diagnosis not present

## 2017-12-29 DIAGNOSIS — N816 Rectocele: Secondary | ICD-10-CM | POA: Diagnosis not present

## 2017-12-29 DIAGNOSIS — K5902 Outlet dysfunction constipation: Secondary | ICD-10-CM | POA: Diagnosis not present

## 2018-01-14 ENCOUNTER — Ambulatory Visit (INDEPENDENT_AMBULATORY_CARE_PROVIDER_SITE_OTHER): Payer: Medicare Other | Admitting: Podiatry

## 2018-01-14 ENCOUNTER — Encounter: Payer: Self-pay | Admitting: Podiatry

## 2018-01-14 ENCOUNTER — Ambulatory Visit (INDEPENDENT_AMBULATORY_CARE_PROVIDER_SITE_OTHER): Payer: Medicare Other

## 2018-01-14 DIAGNOSIS — M25571 Pain in right ankle and joints of right foot: Secondary | ICD-10-CM

## 2018-01-14 DIAGNOSIS — M779 Enthesopathy, unspecified: Secondary | ICD-10-CM | POA: Diagnosis not present

## 2018-01-14 MED ORDER — TRIAMCINOLONE ACETONIDE 10 MG/ML IJ SUSP
10.0000 mg | Freq: Once | INTRAMUSCULAR | Status: AC
  Administered 2018-01-14: 10 mg

## 2018-01-17 NOTE — Progress Notes (Signed)
Subjective:   Patient ID: Rebecca Newton, female   DOB: 72 y.o.   MRN: [id number]   HPI Patient presents stating my right sinus tarsi has improved but it is gotten sore again recently but I was much better for a number of months   ROS      Objective:  Physical Exam  Neurovascular status intact with exquisite discomfort sinus tarsi right which is just occurred over the last week or 2     Assessment:  Acute recurrence of sinus tarsitis right     Plan:  Reinjected the sinus tarsi right 3 mg Kenalog 5 mg Xylocaine and advised this patient on physical therapy anti-inflammatories and supportive shoe gear usage.  Reappoint to recheck

## 2018-02-07 ENCOUNTER — Ambulatory Visit: Payer: Medicare Other | Admitting: Rheumatology

## 2018-02-10 DIAGNOSIS — M533 Sacrococcygeal disorders, not elsewhere classified: Secondary | ICD-10-CM | POA: Diagnosis not present

## 2018-02-11 ENCOUNTER — Other Ambulatory Visit: Payer: Self-pay | Admitting: Internal Medicine

## 2018-02-11 ENCOUNTER — Ambulatory Visit
Admission: RE | Admit: 2018-02-11 | Discharge: 2018-02-11 | Disposition: A | Discharge status: Home / Self Care | Payer: Medicare Other | Source: Ambulatory Visit | Attending: Internal Medicine | Admitting: Internal Medicine

## 2018-02-11 DIAGNOSIS — M533 Sacrococcygeal disorders, not elsewhere classified: Secondary | ICD-10-CM

## 2018-02-11 DIAGNOSIS — M5136 Other intervertebral disc degeneration, lumbar region: Secondary | ICD-10-CM | POA: Diagnosis not present

## 2018-02-16 DIAGNOSIS — M7918 Myalgia, other site: Secondary | ICD-10-CM | POA: Diagnosis not present

## 2018-04-05 DIAGNOSIS — R7303 Prediabetes: Secondary | ICD-10-CM | POA: Diagnosis not present

## 2018-04-11 DIAGNOSIS — Z961 Presence of intraocular lens: Secondary | ICD-10-CM | POA: Diagnosis not present

## 2018-04-11 DIAGNOSIS — E119 Type 2 diabetes mellitus without complications: Secondary | ICD-10-CM | POA: Diagnosis not present

## 2018-04-11 DIAGNOSIS — H40013 Open angle with borderline findings, low risk, bilateral: Secondary | ICD-10-CM | POA: Diagnosis not present

## 2018-04-11 DIAGNOSIS — H01025 Squamous blepharitis left lower eyelid: Secondary | ICD-10-CM | POA: Diagnosis not present

## 2018-04-11 DIAGNOSIS — H01021 Squamous blepharitis right upper eyelid: Secondary | ICD-10-CM | POA: Diagnosis not present

## 2018-04-11 DIAGNOSIS — H16223 Keratoconjunctivitis sicca, not specified as Sjogren's, bilateral: Secondary | ICD-10-CM | POA: Diagnosis not present

## 2018-04-11 DIAGNOSIS — H01024 Squamous blepharitis left upper eyelid: Secondary | ICD-10-CM | POA: Diagnosis not present

## 2018-04-11 DIAGNOSIS — H01022 Squamous blepharitis right lower eyelid: Secondary | ICD-10-CM | POA: Diagnosis not present

## 2018-04-11 DIAGNOSIS — H43821 Vitreomacular adhesion, right eye: Secondary | ICD-10-CM | POA: Diagnosis not present

## 2018-04-26 NOTE — Progress Notes (Signed)
Office Visit Note  Patient: Rebecca Newton             Date of Birth: 11-22-1945           MRN: [id number]             PCP: Josetta Huddle, MD Referring: Josetta Huddle, MD Visit Date: 05/10/2018 Occupation: _0 @  Subjective:  Generalized muscle aches   History of Present Illness: Rebecca Newton is a 72 y.o. female with history of fibromyalgia, DDD, and osteoarthritis.  Patient reports she continues to have generalized muscle aches and muscle tenderness due to fibromyalgia.  She states that her average pain levels between 6-7 out of 10.  She states that she has been sleeping well with Ambien 10 mg at bedtime.  She states she has good days and bad days with her level of fatigue.  She denies any neck pain at this time or symptoms of radiculopathy.  She has occasional right hand pain but denies any joint swelling or stiffness.  She denies any pain in her feet at this time.  She denies any knee pain or joint swelling at this time.  She reports that she has had some intentional weight loss recently.  She states that she has been under tremendous amount of stress since her husband is sick.    Activities of Daily Living:  Patient reports morning stiffness all day.   Patient Denies nocturnal pain.  Difficulty dressing/grooming: Denies Difficulty climbing stairs: Denies Difficulty getting out of chair: Denies Difficulty using hands for taps, buttons, cutlery, and/or writing: Denies  Review of Systems  Constitutional: Positive for fatigue.  HENT: Negative for mouth sores, mouth dryness and nose dryness.   Eyes: Positive for dryness. Negative for pain and visual disturbance.  Respiratory: Negative for cough, hemoptysis, shortness of breath and difficulty breathing.   Cardiovascular: Negative for chest pain, palpitations, hypertension and swelling in legs/feet.  Gastrointestinal: Negative for blood in stool, constipation and diarrhea.  Endocrine: Negative for increased  urination.  Genitourinary: Negative for painful urination.  Musculoskeletal: Positive for myalgias, morning stiffness, muscle tenderness and myalgias. Negative for arthralgias, joint pain, joint swelling and muscle weakness.  Skin: Negative for color change, pallor, rash, hair loss, nodules/bumps, skin tightness, ulcers and sensitivity to sunlight.  Allergic/Immunologic: Negative for susceptible to infections.  Neurological: Positive for headaches. Negative for dizziness, numbness and weakness.  Hematological: Negative for swollen glands.  Psychiatric/Behavioral: Positive for sleep disturbance (Ambien 10 mg po at bedtime ). Negative for depressed mood. The patient is not nervous/anxious.     PMFS History:  Patient Active Problem List   Diagnosis Date Noted  . Primary osteoarthritis of both knees 08/10/2017  . Fecal smearing 08/19/2016  . Rectocele 08/19/2016  . 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 Abdominal pain 10/14/2011  . Chronic fatigue syndrome 10/14/2011  . Chronic migraine without aura 10/14/2011  . Dysuria 10/14/2011  . Increased frequency of urination 10/14/2011  . Irritable bowel syndrome 10/14/2011  . Pain 10/14/2011  . Urinary urgency 10/14/2011  . Osteopenia   . Interstitial cystitis   . Fibromyalgia     Past Medical History:  Diagnosis Date  . Fibromyalgia   . Interstitial cystitis   . Lichen sclerosus   . Osteopenia 12/2015   T score -1.2 FRAX 14%/1.6% stable from prior DEXA    Family History  Problem Relation Age of Onset  . Lung cancer Mother   . Heart disease Mother   . Stroke Father   . Diabetes  Maternal Grandmother   . Heart disease Maternal Grandmother   . Breast cancer Sister        Age 66   Past Surgical History:  Procedure Laterality Date  . BILATERAL SALPINGOOPHORECTOMY    . COMBINED HYSTEROSCOPY DIAGNOSTIC / D&C  2004  . HYSTEROSCOPY    . LAPAROSCOPIC ASSISTED VAGINAL HYSTERECTOMY  2007   Leiomyoma/adenomyosis  . ROTATOR CUFF REPAIR  2006     Social History   Social History Narrative   Lives with spouse   Caffeine use: Drinks decaf passion tea   No soda   Right handed    Objective: Vital Signs: BP 111/68 (BP Location: Right Arm, Patient Position: Sitting, Cuff Size: Normal)   Pulse 61   Resp 13   Ht _0  (1.575 m)   Wt 124 lb 9.6 oz (56.5 kg)   BMI 22.79 kg/m    Physical Exam  Constitutional: She is oriented to person, place, and time. She appears well-developed and well-nourished.  HENT:  Head: Normocephalic and atraumatic.  Eyes: Conjunctivae and EOM are normal.  Neck: Normal range of motion.  Cardiovascular: Normal rate, regular rhythm, normal heart sounds and intact distal pulses.  Pulmonary/Chest: Effort normal and breath sounds normal.  Abdominal: Soft. Bowel sounds are normal.  Lymphadenopathy:    She has no cervical adenopathy.  Neurological: She is alert and oriented to person, place, and time.  Skin: Skin is warm and dry. Capillary refill takes less than 2 seconds.  Psychiatric: She has a normal mood and affect. Her behavior is normal.  Nursing note and vitals reviewed.    Musculoskeletal Exam: C-spine limited range of motion.  Thoracic and lumbar spine good range of motion.  18 out of 18 trigger tender points.  Shoulder joints, elbow joints, wrist joints, MCPs and PIPs, DIPs good range of motion with no synovitis.  She is complete fist formation bilaterally.  Left hip slightly limited range of motion.  Right hip full range of motion with no discomfort.  Knee joints, ankle joints, MTPs, PIPs, DIPs good range of motion with no synovitis.  No warmth or effusion bilateral knee joints.  No tenderness or swelling of ankle joints.  No Achilles tendinitis or plantar fasciitis.  She has tenderness of bilateral trochanter bursa.  CDAI Exam: CDAI Score: Not documented Patient Global Assessment: Not documented; Provider Global Assessment: Not documented Swollen: Not documented; Tender: Not documented Joint  Exam   Not documented   There is currently no information documented on the homunculus. Go to the Rheumatology activity and complete the homunculus joint exam.  Investigation: No additional findings.  Imaging: No results found.  Recent Labs: Lab Results  Component Value Date   WBC 6.2 04/24/2010   HGB 14.2 04/24/2010   PLT 215 04/24/2010   NA 141 04/24/2010   K 3.6 04/24/2010   CL 111 04/24/2010   CO2 22 04/24/2010   GLUCOSE 99 04/24/2010   BUN 16 04/24/2010   CREATININE 0.83 04/24/2010   CALCIUM 9.0 04/24/2010   GFRAA  04/24/2010    >60        The eGFR has been calculated using the MDRD equation. This calculation has not been validated in all clinical situations. eGFR's persistently <60 mL/min signify possible Chronic Kidney Disease.    Speciality Comments: No specialty comments available.  Procedures:  No procedures performed Allergies: Macrodantin; Ciprofloxacin; Lyrica [pregabalin]; and Codeine   Assessment / Plan:     Visit Diagnoses: Fibromyalgia: She continues to have generalized muscle aches  muscle tenderness due to fibromyalgia.  She has generalized hyperalgesia on exam.  She is a 10 out of 18 tender points.  Her pain level on average is a 6-7 out of 10.  She has been sleeping better but taking Ambien 10 mg at bedtime.  She continues to have intermittent fatigue.  She is encouraged to stay active and exercise on a regular basis.  She states that overall she has been feeling better lately.  Primary insomnia - She takes Ambien 10 mg po at bedtime.  Other fatigue: Chronic.  She was encouraged to try to stay active.  Lateral epicondylitis, right elbow: She has tenderness on exam.  DDD (degenerative disc disease), cervical: She is limited range of motion of the C-spine.  No symptoms of radiculopathy at this time.  She has not had any increased pain or stiffness.  Osteopenia of multiple sites  Primary osteoarthritis of both knees - Bilateral knee X-rays  performed on 09/03/16 that revealed moderate osteoarthritis of bilateral knees.  No warmth or effusion noted.  She is good range of motion with no discomfort.  Other medical conditions are listed as follows:  Interstitial cystitis  History of IBS  History of migraine   Orders: No orders of the defined types were placed in this encounter.  No orders of the defined types were placed in this encounter.   Follow-Up Instructions: Return in about 6 months (around 11/09/2018) for Fibromyalgia, Osteoarthritis, DDD.   Ofilia Neas, PA-C  Note - This record has been created using Dragon software.  Chart creation errors have been sought, but may not always  have been located. Such creation errors do not reflect on  the standard of medical care.

## 2018-05-10 ENCOUNTER — Encounter: Payer: Self-pay | Admitting: Physician Assistant

## 2018-05-10 ENCOUNTER — Ambulatory Visit: Payer: Medicare Other | Admitting: Physician Assistant

## 2018-05-10 ENCOUNTER — Ambulatory Visit (INDEPENDENT_AMBULATORY_CARE_PROVIDER_SITE_OTHER): Payer: Medicare Other | Admitting: Physician Assistant

## 2018-05-10 VITALS — BP 111/68 | HR 61 | Resp 13 | Ht 62.0 in | Wt 124.6 lb

## 2018-05-10 DIAGNOSIS — M7711 Lateral epicondylitis, right elbow: Secondary | ICD-10-CM | POA: Diagnosis not present

## 2018-05-10 DIAGNOSIS — R5383 Other fatigue: Secondary | ICD-10-CM

## 2018-05-10 DIAGNOSIS — N3010 Interstitial cystitis (chronic) without hematuria: Secondary | ICD-10-CM | POA: Diagnosis not present

## 2018-05-10 DIAGNOSIS — Z8719 Personal history of other diseases of the digestive system: Secondary | ICD-10-CM | POA: Diagnosis not present

## 2018-05-10 DIAGNOSIS — M8589 Other specified disorders of bone density and structure, multiple sites: Secondary | ICD-10-CM

## 2018-05-10 DIAGNOSIS — M797 Fibromyalgia: Secondary | ICD-10-CM | POA: Diagnosis not present

## 2018-05-10 DIAGNOSIS — F5101 Primary insomnia: Secondary | ICD-10-CM

## 2018-05-10 DIAGNOSIS — M170 Bilateral primary osteoarthritis of knee: Secondary | ICD-10-CM | POA: Diagnosis not present

## 2018-05-10 DIAGNOSIS — Z8669 Personal history of other diseases of the nervous system and sense organs: Secondary | ICD-10-CM

## 2018-05-10 DIAGNOSIS — M5030 Other cervical disc degeneration, unspecified cervical region: Secondary | ICD-10-CM | POA: Diagnosis not present

## 2018-05-16 ENCOUNTER — Ambulatory Visit: Payer: Medicare Other | Admitting: Physician Assistant

## 2018-06-06 ENCOUNTER — Ambulatory Visit
Admission: RE | Admit: 2018-06-06 | Discharge: 2018-06-06 | Disposition: A | Discharge status: Home / Self Care | Payer: Medicare Other | Source: Ambulatory Visit | Attending: Gynecology | Admitting: Gynecology

## 2018-06-06 ENCOUNTER — Telehealth: Payer: Self-pay | Admitting: *Deleted

## 2018-06-06 DIAGNOSIS — Z1231 Encounter for screening mammogram for malignant neoplasm of breast: Secondary | ICD-10-CM

## 2018-06-06 NOTE — Telephone Encounter (Signed)
Patient informed with normal mammogram results.

## 2018-06-27 DIAGNOSIS — E7800 Pure hypercholesterolemia, unspecified: Secondary | ICD-10-CM | POA: Diagnosis not present

## 2018-06-27 DIAGNOSIS — E559 Vitamin D deficiency, unspecified: Secondary | ICD-10-CM | POA: Diagnosis not present

## 2018-06-27 DIAGNOSIS — B373 Candidiasis of vulva and vagina: Secondary | ICD-10-CM | POA: Diagnosis not present

## 2018-06-27 DIAGNOSIS — Z0000 Encounter for general adult medical examination without abnormal findings: Secondary | ICD-10-CM | POA: Diagnosis not present

## 2018-06-27 DIAGNOSIS — R7309 Other abnormal glucose: Secondary | ICD-10-CM | POA: Diagnosis not present

## 2018-06-27 DIAGNOSIS — Z1389 Encounter for screening for other disorder: Secondary | ICD-10-CM | POA: Diagnosis not present

## 2018-06-27 DIAGNOSIS — F411 Generalized anxiety disorder: Secondary | ICD-10-CM | POA: Diagnosis not present

## 2018-06-27 DIAGNOSIS — L509 Urticaria, unspecified: Secondary | ICD-10-CM | POA: Diagnosis not present

## 2018-06-27 DIAGNOSIS — G4700 Insomnia, unspecified: Secondary | ICD-10-CM | POA: Diagnosis not present

## 2018-06-30 ENCOUNTER — Telehealth (INDEPENDENT_AMBULATORY_CARE_PROVIDER_SITE_OTHER): Payer: Self-pay | Admitting: *Deleted

## 2018-06-30 DIAGNOSIS — M25562 Pain in left knee: Secondary | ICD-10-CM | POA: Diagnosis not present

## 2018-06-30 NOTE — Telephone Encounter (Signed)
Cassandra called pt to get her scheduled and pt states that she had an Xray done and that her knee was bruised and did not want to make appt.

## 2018-07-29 DIAGNOSIS — L989 Disorder of the skin and subcutaneous tissue, unspecified: Secondary | ICD-10-CM | POA: Diagnosis not present

## 2018-08-03 DIAGNOSIS — L859 Epidermal thickening, unspecified: Secondary | ICD-10-CM | POA: Diagnosis not present

## 2018-08-03 DIAGNOSIS — L8990 Pressure ulcer of unspecified site, unspecified stage: Secondary | ICD-10-CM | POA: Diagnosis not present

## 2018-08-17 DIAGNOSIS — J988 Other specified respiratory disorders: Secondary | ICD-10-CM | POA: Diagnosis not present

## 2018-08-17 DIAGNOSIS — R509 Fever, unspecified: Secondary | ICD-10-CM | POA: Diagnosis not present

## 2018-08-17 DIAGNOSIS — L8990 Pressure ulcer of unspecified site, unspecified stage: Secondary | ICD-10-CM | POA: Diagnosis not present

## 2018-08-17 DIAGNOSIS — R6883 Chills (without fever): Secondary | ICD-10-CM | POA: Diagnosis not present

## 2018-08-31 ENCOUNTER — Encounter: Payer: Medicare Other | Admitting: Gynecology

## 2018-09-01 ENCOUNTER — Encounter: Payer: Medicare Other | Admitting: Gynecology

## 2018-10-06 ENCOUNTER — Encounter: Payer: Medicare Other | Admitting: Gynecology

## 2018-11-09 ENCOUNTER — Ambulatory Visit: Payer: Medicare Other | Admitting: Physician Assistant

## 2018-11-22 ENCOUNTER — Encounter: Payer: Medicare Other | Admitting: Gynecology

## 2018-12-14 ENCOUNTER — Other Ambulatory Visit: Payer: Self-pay

## 2018-12-15 ENCOUNTER — Ambulatory Visit (INDEPENDENT_AMBULATORY_CARE_PROVIDER_SITE_OTHER): Payer: Medicare Other | Admitting: Gynecology

## 2018-12-15 ENCOUNTER — Encounter: Payer: Medicare Other | Admitting: Gynecology

## 2018-12-15 ENCOUNTER — Encounter: Payer: Self-pay | Admitting: Gynecology

## 2018-12-15 VITALS — BP 122/74 | Ht 61.5 in | Wt 116.0 lb

## 2018-12-15 DIAGNOSIS — Z01419 Encounter for gynecological examination (general) (routine) without abnormal findings: Secondary | ICD-10-CM

## 2018-12-15 DIAGNOSIS — N952 Postmenopausal atrophic vaginitis: Secondary | ICD-10-CM

## 2018-12-15 DIAGNOSIS — M8588 Other specified disorders of bone density and structure, other site: Secondary | ICD-10-CM

## 2018-12-15 NOTE — Progress Notes (Signed)
    Dajah Fischman Woodard-Hipps 09/10/1945 [id number]        73 y.o.  G1P1001 for breast and pelvic exam.  Without gynecologic complaints  Past medical history,surgical history, problem list, medications, allergies, family history and social history were all reviewed and documented as reviewed in the EPIC chart.  ROS:  Performed with pertinent positives and negatives included in the history, assessment and plan.   Additional significant findings : None   Exam: Caryn Bee assistant Vitals:   12/15/18 0947  BP: 122/74  Weight: 116 lb (52.6 kg)  Height: 5' 1.5" (1.562 m)   Body mass index is 21.56 kg/m.  General appearance:  Normal affect, orientation and appearance. Skin: Grossly normal HEENT: Without gross lesions.  No cervical or supraclavicular adenopathy. Thyroid normal.  Lungs:  Clear without wheezing, rales or rhonchi Cardiac: RR, without RMG Abdominal:  Soft, nontender, without masses, guarding, rebound, organomegaly or hernia Breasts:  Examined lying and sitting without masses, retractions, discharge or axillary adenopathy. Pelvic:  Ext, BUS, Vagina: With atrophic changes.  Mild rectocele noted  Adnexa: Without masses or tenderness    Anus and perineum: Normal   Rectovaginal: Normal sphincter tone without palpated masses or tenderness.    Assessment/Plan:  73 y.o. G31P1001 female for breast and pelvic exam.  Status post LAVH BSO 2007 for adenomyosis and leiomyoma.  1. Postmenopausal.  No significant menopausal symptoms. 2. History of rectocele.  Had seen Dr. [place] Pink.  Is not significantly symptomatic.  Overall stable or slightly improved on exam.  Continue to monitor and follow-up if symptoms develop. 3. Osteopenia.  DEXA 2017 T score -1.2 FRAX 14% / 1.6%.  Recommend follow-up DEXA later this summer and she is going to arrange for this. 4. Pap smear 2019.  No Pap smear done today.  History of cryosurgery over 30 years ago.  Options to stop screening per current  screening guidelines based on age and hysterectomy history reviewed.  Will readdress on an annual basis. 5. Colonoscopy 2011.  Plan repeat colonoscopy next year at 10-year interval. 6. Mammography 05/2018.  Continue with annual mammography when due.  Breast exam normal today. 7. Health maintenance.  No routine lab work done as patient does this elsewhere.  Follow-up 1 year, sooner as needed.   Anastasio Auerbach MD, 11:13 AM 12/15/2018

## 2018-12-15 NOTE — Patient Instructions (Signed)
Follow-up in 1 year for annual exam, sooner as needed. 

## 2019-01-02 DIAGNOSIS — E559 Vitamin D deficiency, unspecified: Secondary | ICD-10-CM | POA: Diagnosis not present

## 2019-01-02 DIAGNOSIS — L509 Urticaria, unspecified: Secondary | ICD-10-CM | POA: Diagnosis not present

## 2019-01-02 DIAGNOSIS — R7303 Prediabetes: Secondary | ICD-10-CM | POA: Diagnosis not present

## 2019-01-02 DIAGNOSIS — L570 Actinic keratosis: Secondary | ICD-10-CM | POA: Diagnosis not present

## 2019-01-02 DIAGNOSIS — R0781 Pleurodynia: Secondary | ICD-10-CM | POA: Diagnosis not present

## 2019-01-02 DIAGNOSIS — F411 Generalized anxiety disorder: Secondary | ICD-10-CM | POA: Diagnosis not present

## 2019-01-02 DIAGNOSIS — E7800 Pure hypercholesterolemia, unspecified: Secondary | ICD-10-CM | POA: Diagnosis not present

## 2019-01-02 DIAGNOSIS — G4700 Insomnia, unspecified: Secondary | ICD-10-CM | POA: Diagnosis not present

## 2019-01-06 ENCOUNTER — Other Ambulatory Visit: Payer: Self-pay | Admitting: Gynecology

## 2019-01-06 DIAGNOSIS — Z1231 Encounter for screening mammogram for malignant neoplasm of breast: Secondary | ICD-10-CM

## 2019-01-18 ENCOUNTER — Encounter: Payer: Medicare Other | Admitting: Gynecology

## 2019-01-18 ENCOUNTER — Other Ambulatory Visit: Payer: Self-pay

## 2019-01-18 ENCOUNTER — Encounter: Payer: Self-pay | Admitting: Gynecology

## 2019-01-18 ENCOUNTER — Ambulatory Visit (INDEPENDENT_AMBULATORY_CARE_PROVIDER_SITE_OTHER): Payer: Medicare Other | Admitting: Gynecology

## 2019-01-18 VITALS — BP 122/70

## 2019-01-18 DIAGNOSIS — N644 Mastodynia: Secondary | ICD-10-CM | POA: Diagnosis not present

## 2019-01-18 NOTE — Progress Notes (Signed)
    Rebecca Newton Aug 25, 1945 [id number]        72 y.o.  G1P1001 presents with 1 week history of left breast pain.  No precipitating events.  No palpable masses or nipple discharge.  Last mammogram 05/2018.  Past medical history,surgical history, problem list, medications, allergies, family history and social history were all reviewed and documented in the EPIC chart.  Directed ROS with pertinent positives and negatives documented in the history of present illness/assessment and plan.  Exam: Caryn Bee assistant Vitals:   01/18/19 1014  BP: 122/70   General appearance:  Normal Both breasts examined lying and sitting without masses, retractions, discharge, adenopathy.  Area of concern is left tail of Spence.  No abnormalities palpated in this area.  Assessment/Plan:  73 y.o. G1P1001 with new onset left breast pain.  Diffuse throughout left tail of Spence.  No trauma or other identifiable precipitating events.  Discussed differential to include hormonal, trauma and perceived by patient, benign cysts up to and including malignancy.  Will start with diagnostic mammography and ultrasound.  Heat to the area with ibuprofen.  If studies are negative then will follow for now.  If persists show represent for further evaluation.  If resolves then will follow.    Anastasio Auerbach MD, 10:58 AM 01/18/2019

## 2019-01-18 NOTE — Patient Instructions (Signed)
Breast center will call to schedule your mammogram and ultrasound.  Call if you do not hear from them within a week.

## 2019-01-24 ENCOUNTER — Telehealth: Payer: Self-pay

## 2019-01-24 DIAGNOSIS — N644 Mastodynia: Secondary | ICD-10-CM

## 2019-01-24 NOTE — Telephone Encounter (Signed)
Patient was in last week with breast problem and Dr. Loetta Rough said breast studies would be ordered. "Will start with diagnostic mammography and ultrasound. "  Patient has not hear anything yet. I did not see where Dr. Loetta Rough sent you the referral request.

## 2019-01-24 NOTE — Telephone Encounter (Signed)
Patient scheduled at breast center on 02/01/19 @ 9:00am, patient informed to bring mask, insurance card and come alone to appointment due to Covid restrictions.

## 2019-02-01 ENCOUNTER — Ambulatory Visit
Admission: RE | Admit: 2019-02-01 | Discharge: 2019-02-01 | Disposition: A | Discharge status: Home / Self Care | Payer: Medicare Other | Source: Ambulatory Visit | Attending: Gynecology | Admitting: Gynecology

## 2019-02-01 ENCOUNTER — Ambulatory Visit: Payer: Medicare Other

## 2019-02-01 ENCOUNTER — Other Ambulatory Visit: Payer: Self-pay

## 2019-02-01 DIAGNOSIS — R928 Other abnormal and inconclusive findings on diagnostic imaging of breast: Secondary | ICD-10-CM | POA: Diagnosis not present

## 2019-02-01 DIAGNOSIS — N644 Mastodynia: Secondary | ICD-10-CM

## 2019-02-24 DIAGNOSIS — H0102B Squamous blepharitis left eye, upper and lower eyelids: Secondary | ICD-10-CM | POA: Diagnosis not present

## 2019-02-24 DIAGNOSIS — H16223 Keratoconjunctivitis sicca, not specified as Sjogren's, bilateral: Secondary | ICD-10-CM | POA: Diagnosis not present

## 2019-02-24 DIAGNOSIS — H0011 Chalazion right upper eyelid: Secondary | ICD-10-CM | POA: Diagnosis not present

## 2019-02-24 DIAGNOSIS — H0102A Squamous blepharitis right eye, upper and lower eyelids: Secondary | ICD-10-CM | POA: Diagnosis not present

## 2019-03-02 ENCOUNTER — Encounter: Payer: Self-pay | Admitting: Podiatry

## 2019-03-02 ENCOUNTER — Ambulatory Visit (INDEPENDENT_AMBULATORY_CARE_PROVIDER_SITE_OTHER): Payer: Medicare Other | Admitting: Podiatry

## 2019-03-02 ENCOUNTER — Ambulatory Visit (INDEPENDENT_AMBULATORY_CARE_PROVIDER_SITE_OTHER): Payer: Medicare Other

## 2019-03-02 ENCOUNTER — Other Ambulatory Visit: Payer: Self-pay

## 2019-03-02 ENCOUNTER — Other Ambulatory Visit: Payer: Self-pay | Admitting: Podiatry

## 2019-03-02 VITALS — Temp 97.3°F

## 2019-03-02 DIAGNOSIS — M79672 Pain in left foot: Secondary | ICD-10-CM

## 2019-03-02 DIAGNOSIS — M722 Plantar fascial fibromatosis: Secondary | ICD-10-CM

## 2019-03-02 NOTE — Patient Instructions (Signed)

## 2019-03-02 NOTE — Progress Notes (Signed)
Subjective:   Patient ID: Rebecca Newton, female   DOB: 73 y.o.   MRN: [id number]   HPI Patient states she has developed a lot of pain in the plantar aspect of her left heel over the last month and worse over the last few weeks.  States her right foot that we worked on previously is doing very well   ROS      Objective:  Physical Exam  Neurovascular status intact with patient's left plantar heel found to be very tender at the insertion of the tendon into the calcaneus with inflammation fluid buildup     Assessment:  Acute plantar fasciitis left with inflammation fluid buildup     Plan:  H&P condition reviewed and went ahead today did sterile prep and injected the plantar fascial 3 mg Kenalog 5 mg Xylocaine and applied fascial brace to lift up the arch.  Reappoint to recheck  X-ray indicates large spur no indication to stress fracture arthritis

## 2019-03-16 ENCOUNTER — Ambulatory Visit: Payer: Medicare Other | Admitting: Podiatry

## 2019-03-30 ENCOUNTER — Ambulatory Visit: Payer: Medicare Other | Admitting: Podiatry

## 2019-04-06 ENCOUNTER — Ambulatory Visit (INDEPENDENT_AMBULATORY_CARE_PROVIDER_SITE_OTHER): Payer: Medicare Other | Admitting: Podiatry

## 2019-04-06 ENCOUNTER — Other Ambulatory Visit: Payer: Self-pay

## 2019-04-06 ENCOUNTER — Encounter: Payer: Self-pay | Admitting: Podiatry

## 2019-04-06 DIAGNOSIS — M7672 Peroneal tendinitis, left leg: Secondary | ICD-10-CM | POA: Diagnosis not present

## 2019-04-06 DIAGNOSIS — M722 Plantar fascial fibromatosis: Secondary | ICD-10-CM | POA: Diagnosis not present

## 2019-04-07 NOTE — Progress Notes (Signed)
Subjective:   Patient ID: Rebecca Newton, female   DOB: 73 y.o.   MRN: [id number]   HPI Patient states still having moderate pain in the heel but the pain seems to have moved into the top of the foot as she appears to be walking differently from the discomfort she was experiencing   ROS      Objective:  Physical Exam  Neurovascular status intact with patient having 2 separate problems with one being continued plantar fasciitis left that is improved but still present and the other being extensor tendinitis left that is tender     Assessment:  Pain to the heel still noted and I noted there to be pain on the top of the foot consistent with extensor tendinitis     Plan:  H&P reviewed both conditions and for the plantar fascial continue physical therapy shoe gear modifications and for the dorsal tendon complex I did sterile prep and injected with 3 mg Dexasone Kenalog 5 mg Xylocaine and advised on heat ice therapy

## 2019-04-17 ENCOUNTER — Telehealth: Payer: Self-pay | Admitting: Podiatry

## 2019-04-17 DIAGNOSIS — H0102A Squamous blepharitis right eye, upper and lower eyelids: Secondary | ICD-10-CM | POA: Diagnosis not present

## 2019-04-17 DIAGNOSIS — H40013 Open angle with borderline findings, low risk, bilateral: Secondary | ICD-10-CM | POA: Diagnosis not present

## 2019-04-17 DIAGNOSIS — E119 Type 2 diabetes mellitus without complications: Secondary | ICD-10-CM | POA: Diagnosis not present

## 2019-04-17 DIAGNOSIS — Z961 Presence of intraocular lens: Secondary | ICD-10-CM | POA: Diagnosis not present

## 2019-04-17 DIAGNOSIS — H0102B Squamous blepharitis left eye, upper and lower eyelids: Secondary | ICD-10-CM | POA: Diagnosis not present

## 2019-04-17 DIAGNOSIS — H43821 Vitreomacular adhesion, right eye: Secondary | ICD-10-CM | POA: Diagnosis not present

## 2019-04-17 DIAGNOSIS — H16223 Keratoconjunctivitis sicca, not specified as Sjogren's, bilateral: Secondary | ICD-10-CM | POA: Diagnosis not present

## 2019-04-17 NOTE — Telephone Encounter (Signed)
Pt called requesting a call from the nurse to discuss the pain she is having in her foot. Pt has been seen for plantar fasciitis multiple times and over the weekend she felt a 'pop' in her foot and has severe pain now. Pt is unable to put pressure on her foot. Pt was offered an appt this afternoon but was unable to come in.

## 2019-04-17 NOTE — Telephone Encounter (Signed)
Pt states Friday she got out of the shower and went out on the deck and felt something pop at the left side of the left foot, sleeping with the brace and using the NSAIDS. Pt asked if she should wait until next week and it told her she may have a stress fracture or she may have popped the plantar fascia, so I felt she should come in to see our new doctor that is seeing Dr. Mellody Drown pts while he is out. I also told pt to ice the area also. Pt agreed and I transferred to schedulers.

## 2019-04-20 ENCOUNTER — Ambulatory Visit (INDEPENDENT_AMBULATORY_CARE_PROVIDER_SITE_OTHER): Payer: Medicare Other | Admitting: Podiatry

## 2019-04-20 ENCOUNTER — Other Ambulatory Visit: Payer: Self-pay | Admitting: Podiatry

## 2019-04-20 ENCOUNTER — Ambulatory Visit (INDEPENDENT_AMBULATORY_CARE_PROVIDER_SITE_OTHER): Payer: Medicare Other

## 2019-04-20 ENCOUNTER — Other Ambulatory Visit: Payer: Self-pay

## 2019-04-20 ENCOUNTER — Encounter: Payer: Self-pay | Admitting: Podiatry

## 2019-04-20 DIAGNOSIS — M7672 Peroneal tendinitis, left leg: Secondary | ICD-10-CM

## 2019-04-20 DIAGNOSIS — M79672 Pain in left foot: Secondary | ICD-10-CM

## 2019-04-20 DIAGNOSIS — M722 Plantar fascial fibromatosis: Secondary | ICD-10-CM | POA: Diagnosis not present

## 2019-04-20 MED ORDER — IBUPROFEN 800 MG PO TABS: 800.0000 mg | ORAL_TABLET | Freq: Three times a day (TID) | ORAL | 0 refills | Status: DC | PRN

## 2019-04-20 NOTE — Progress Notes (Signed)
Subjective:  Patient ID: Rebecca Newton, female    DOB: 1946-04-25,  MRN: [id number]  Chief Complaint  Patient presents with  . Foot Pain    pt states that she is having pain located on the lateral side of the right foot bottom of the heel, and the medial side of the right foot, pt states that she felt a "popping" noise when walking out of her house    73 y.o. female presents with the above complaint. Hx confirmed with patient.  She states that she has pain on the left foot on the lateral side and left foot bottom of the heel.  Patient states it happened about 5 days ago.  She states that she felt a pop when walking out the door barefoot.  She states it is an aching pain and pain is elicited when applying pressure.  Patient has tried plantar fascia brace and a new balance shoes but it has not been helping regarding the possible tendinitis.  Review of Systems: Negative except as noted in the HPI. Denies N/V/F/Ch.  Past Medical History:  Diagnosis Date  . Fibromyalgia   . Interstitial cystitis   . Lichen sclerosus   . Osteopenia 12/2015   T score -1.2 FRAX 14%/1.6% stable from prior DEXA    Current Outpatient Medications:  .  diazepam (VALIUM) 10 MG tablet, 1 per vagina daily as needed, Disp: , Rfl:  .  doxepin (SINEQUAN) 10 MG capsule, as needed. , Disp: , Rfl: 2 .  doxycycline (VIBRAMYCIN) 100 MG capsule, , Disp: , Rfl:  .  hydrOXYzine (ATARAX/VISTARIL) 25 MG tablet, Take 50 mg by mouth as needed. , Disp: , Rfl:  .  lidocaine (XYLOCAINE) 2 % jelly, 1 application as needed., Disp: , Rfl:  .  metFORMIN (GLUCOPHAGE) 500 MG tablet, Take 0.5 tablets by mouth 2 (two) times daily., Disp: , Rfl: 3 .  PREMARIN vaginal cream, INSERT 1/2 GRAM IN VAGINA 3 TIMES A WEEK., Disp: , Rfl: 3 .  rizatriptan (MAXALT) 10 MG tablet, TK 1 T PO ONCE A DAY PRF MIGRAINE HA, Disp: , Rfl: 1 .  rosuvastatin (CRESTOR) 20 MG tablet, Take 20 mg by mouth daily., Disp: , Rfl:  .  zolpidem (AMBIEN) 10 MG tablet,  Take 10 mg by mouth at bedtime as needed.  , Disp: , Rfl:  .  ibuprofen (ADVIL) 800 MG tablet, Take 1 tablet (800 mg total) by mouth every 8 (eight) hours as needed., Disp: 30 tablet, Rfl: 0  Social History   Tobacco Use  Smoking Status Never Smoker  Smokeless Tobacco Never Used    Allergies  Allergen Reactions  . Macrodantin Hives  . Ciprofloxacin     Anxious and hives  . Lyrica [Pregabalin] Nausea Only    Dizziness  . Codeine Anxiety   Objective:  There were no vitals filed for this visit. There is no height or weight on file to calculate BMI. Constitutional no acute distress, alert/oriented x3 and appropriate mood and affect  Vascular Dorsalis pedis pulse: present Posterior tibial pulse: present and  Hair pattern: normal Warmth: no warmth  Neurologic Epicritic sensations intact  Dermatologic Skin normal Nails normal nails without lesions  Orthopedic:  Upon examination patient has a pain along the palpation along the peroneal tendons and surrounding the fifth metatarsal base. pain on the plantar medial tubercle of the calcaneus upon palpation along the origin of the plantar fascia.    Radiographs: 2 views of the skeletally mature adult left foot.  There is  no signs of fracture noted.  There is a mild decrease in the first MPJ space.  There are no other bone abnormalities noted. Assessment:   1. Left foot pain   2. Plantar fasciitis of left foot   3. Peroneal tendinitis of left lower extremity    Plan:  Patient was evaluated and treated and all questions answered.  -Plantar fasciitis left foot    - XR reviewed as above.  - Educated on icing and stretching. Instructions given.  - Injection delivered to the plantar fascia as below.  -Peroneal tendinitis of the left foot    -CAM boot dispensed  Procedure: Injection Tendon/Ligament Location: Left plantar fascia at the glabrous junction; medial approach. Skin Prep: alcohol Injectate: 0.5 cc 0.5% marcaine plain, 0.5  cc of 1% Lidocaine, 0.5 cc kenalog 10. Disposition: Patient tolerated procedure well. Injection site dressed with a band-aid.   Return in about 4 weeks (around 05/18/2019).

## 2019-04-23 DIAGNOSIS — S5012XA Contusion of left forearm, initial encounter: Secondary | ICD-10-CM | POA: Diagnosis not present

## 2019-04-24 NOTE — Progress Notes (Deleted)
Office Visit Note  Patient: Rebecca Newton             Date of Birth: 12-Feb-1946           MRN: [id number]             PCP: Josetta Huddle, MD Referring: Josetta Huddle, MD Visit Date: 05/08/2019 Occupation: _0 @  Subjective:  No chief complaint on file.   History of Present Illness: Rebecca Newton is a 73 y.o. female ***   Activities of Daily Living:  Patient reports morning stiffness for *** {minute/hour:19697}.   Patient {ACTIONS;DENIES/REPORTS:21021675::"Denies"} nocturnal pain.  Difficulty dressing/grooming: {ACTIONS;DENIES/REPORTS:21021675::"Denies"} Difficulty climbing stairs: {ACTIONS;DENIES/REPORTS:21021675::"Denies"} Difficulty getting out of chair: {ACTIONS;DENIES/REPORTS:21021675::"Denies"} Difficulty using hands for taps, buttons, cutlery, and/or writing: {ACTIONS;DENIES/REPORTS:21021675::"Denies"}  No Rheumatology ROS completed.   PMFS History:  Patient Active Problem List   Diagnosis Date Noted  . Primary osteoarthritis of both knees 08/10/2017  . Fecal smearing 08/19/2016  . Rectocele 08/19/2016  . 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 Abdominal pain 10/14/2011  . Chronic fatigue syndrome 10/14/2011  . Chronic migraine without aura 10/14/2011  . Dysuria 10/14/2011  . Increased frequency of urination 10/14/2011  . Irritable bowel syndrome 10/14/2011  . Pain 10/14/2011  . Urinary urgency 10/14/2011  . Osteopenia   . Interstitial cystitis   . Fibromyalgia     Past Medical History:  Diagnosis Date  . Fibromyalgia   . Interstitial cystitis   . Lichen sclerosus   . Osteopenia 12/2015   T score -1.2 FRAX 14%/1.6% stable from prior DEXA    Family History  Problem Relation Age of Onset  . Lung cancer Mother   . Heart disease Mother   . Stroke Father   . Diabetes Maternal Grandmother   . Heart disease Maternal Grandmother   . Breast cancer Sister        Age 73   Past Surgical History:  Procedure Laterality Date  . BILATERAL  SALPINGOOPHORECTOMY    . BREAST BIOPSY Right   . COMBINED HYSTEROSCOPY DIAGNOSTIC / D&C  2004  . HYSTEROSCOPY    . LAPAROSCOPIC ASSISTED VAGINAL HYSTERECTOMY  2007   Leiomyoma/adenomyosis  . ROTATOR CUFF REPAIR  2006   Social History   Social History Narrative   Lives with spouse   Caffeine use: Drinks decaf passion tea   No soda   Right handed   Immunization History  Administered Date(s) Administered  . Zoster Recombinat (Shingrix) 06/23/2017, 09/08/2017     Objective: Vital Signs: There were no vitals taken for this visit.   Physical Exam   Musculoskeletal Exam: ***  CDAI Exam: CDAI Score: - Patient Global: -; Provider Global: - Swollen: -; Tender: - Joint Exam   No joint exam has been documented for this visit   There is currently no information documented on the homunculus. Go to the Rheumatology activity and complete the homunculus joint exam.  Investigation: No additional findings.  Imaging: Dg Foot Complete Left  Result Date: 04/20/2019 Please see detailed radiograph report in office note.   Recent Labs: Lab Results  Component Value Date   WBC 6.2 04/24/2010   HGB 14.2 04/24/2010   PLT 215 04/24/2010   NA 141 04/24/2010   K 3.6 04/24/2010   CL 111 04/24/2010   CO2 22 04/24/2010   GLUCOSE 99 04/24/2010   BUN 16 04/24/2010   CREATININE 0.83 04/24/2010   CALCIUM 9.0 04/24/2010   GFRAA  04/24/2010    >60        The eGFR has been  calculated using the MDRD equation. This calculation has not been validated in all clinical situations. eGFR's persistently <60 mL/min signify possible Chronic Kidney Disease.    Speciality Comments: No specialty comments available.  Procedures:  No procedures performed Allergies: Macrodantin, Ciprofloxacin, Lyrica [pregabalin], and Codeine   Assessment / Plan:     Visit Diagnoses: No diagnosis found.  Orders: No orders of the defined types were placed in this encounter.  No orders of the defined types  were placed in this encounter.   Face-to-face time spent with patient was *** minutes. Greater than 50% of time was spent in counseling and coordination of care.  Follow-Up Instructions: No follow-ups on file.   Ofilia Neas, PA-C  Note - This record has been created using Dragon software.  Chart creation errors have been sought, but may not always  have been located. Such creation errors do not reflect on  the standard of medical care.

## 2019-05-03 ENCOUNTER — Ambulatory Visit (INDEPENDENT_AMBULATORY_CARE_PROVIDER_SITE_OTHER): Payer: Medicare Other | Admitting: Podiatry

## 2019-05-03 ENCOUNTER — Encounter: Payer: Self-pay | Admitting: Podiatry

## 2019-05-03 ENCOUNTER — Other Ambulatory Visit: Payer: Self-pay

## 2019-05-03 ENCOUNTER — Encounter: Payer: Self-pay | Admitting: Gynecology

## 2019-05-03 DIAGNOSIS — M722 Plantar fascial fibromatosis: Secondary | ICD-10-CM

## 2019-05-03 DIAGNOSIS — M779 Enthesopathy, unspecified: Secondary | ICD-10-CM

## 2019-05-07 NOTE — Progress Notes (Signed)
Subjective:   Patient ID: Rebecca Newton, female   DOB: 73 y.o.   MRN: [id number]   HPI Patient presents stating that the pain seems to have moved from the front of the foot to the back of the foot and states the foot still bothers her some but not to the same degree but the back is quite sore   ROS      Objective:  Physical Exam  Neurovascular status intact with patient's plantar lateral aspect of the left heel being inflamed with fluid buildup and discomfort in the forefoot being much improved with no inflammation or fluid currently noted     Assessment:  Inflammatory fasciitis left that is present with capsulitis left which appears to be moderately improved     Plan:  H&P conditions reviewed and for the plantar heel I did do sterile prep and injected the fascia 3 mg Kenalog 5 mg Xylocaine and for the forefoot capsule I recommended supportive shoes anti-inflammatory and patient will be seen back as needed

## 2019-05-08 ENCOUNTER — Ambulatory Visit: Payer: Self-pay | Admitting: Physician Assistant

## 2019-05-08 DIAGNOSIS — J988 Other specified respiratory disorders: Secondary | ICD-10-CM | POA: Diagnosis not present

## 2019-05-08 DIAGNOSIS — R05 Cough: Secondary | ICD-10-CM | POA: Diagnosis not present

## 2019-05-10 DIAGNOSIS — M797 Fibromyalgia: Secondary | ICD-10-CM | POA: Diagnosis not present

## 2019-05-10 DIAGNOSIS — M62572 Muscle wasting and atrophy, not elsewhere classified, left ankle and foot: Secondary | ICD-10-CM | POA: Diagnosis not present

## 2019-05-10 DIAGNOSIS — M25672 Stiffness of left ankle, not elsewhere classified: Secondary | ICD-10-CM | POA: Diagnosis not present

## 2019-05-10 DIAGNOSIS — M25572 Pain in left ankle and joints of left foot: Secondary | ICD-10-CM | POA: Diagnosis not present

## 2019-05-10 DIAGNOSIS — R269 Unspecified abnormalities of gait and mobility: Secondary | ICD-10-CM | POA: Diagnosis not present

## 2019-05-10 DIAGNOSIS — M62562 Muscle wasting and atrophy, not elsewhere classified, left lower leg: Secondary | ICD-10-CM | POA: Diagnosis not present

## 2019-05-11 DIAGNOSIS — M797 Fibromyalgia: Secondary | ICD-10-CM | POA: Diagnosis not present

## 2019-05-11 DIAGNOSIS — M25572 Pain in left ankle and joints of left foot: Secondary | ICD-10-CM | POA: Diagnosis not present

## 2019-05-11 DIAGNOSIS — M62562 Muscle wasting and atrophy, not elsewhere classified, left lower leg: Secondary | ICD-10-CM | POA: Diagnosis not present

## 2019-05-11 DIAGNOSIS — M25672 Stiffness of left ankle, not elsewhere classified: Secondary | ICD-10-CM | POA: Diagnosis not present

## 2019-05-11 DIAGNOSIS — M62572 Muscle wasting and atrophy, not elsewhere classified, left ankle and foot: Secondary | ICD-10-CM | POA: Diagnosis not present

## 2019-05-11 DIAGNOSIS — R269 Unspecified abnormalities of gait and mobility: Secondary | ICD-10-CM | POA: Diagnosis not present

## 2019-05-15 DIAGNOSIS — M62562 Muscle wasting and atrophy, not elsewhere classified, left lower leg: Secondary | ICD-10-CM | POA: Diagnosis not present

## 2019-05-15 DIAGNOSIS — M25572 Pain in left ankle and joints of left foot: Secondary | ICD-10-CM | POA: Diagnosis not present

## 2019-05-15 DIAGNOSIS — R269 Unspecified abnormalities of gait and mobility: Secondary | ICD-10-CM | POA: Diagnosis not present

## 2019-05-15 DIAGNOSIS — M62572 Muscle wasting and atrophy, not elsewhere classified, left ankle and foot: Secondary | ICD-10-CM | POA: Diagnosis not present

## 2019-05-15 DIAGNOSIS — M797 Fibromyalgia: Secondary | ICD-10-CM | POA: Diagnosis not present

## 2019-05-15 DIAGNOSIS — M25672 Stiffness of left ankle, not elsewhere classified: Secondary | ICD-10-CM | POA: Diagnosis not present

## 2019-05-16 DIAGNOSIS — M25572 Pain in left ankle and joints of left foot: Secondary | ICD-10-CM | POA: Diagnosis not present

## 2019-05-16 DIAGNOSIS — R269 Unspecified abnormalities of gait and mobility: Secondary | ICD-10-CM | POA: Diagnosis not present

## 2019-05-16 DIAGNOSIS — M62562 Muscle wasting and atrophy, not elsewhere classified, left lower leg: Secondary | ICD-10-CM | POA: Diagnosis not present

## 2019-05-16 DIAGNOSIS — M797 Fibromyalgia: Secondary | ICD-10-CM | POA: Diagnosis not present

## 2019-05-16 DIAGNOSIS — M25672 Stiffness of left ankle, not elsewhere classified: Secondary | ICD-10-CM | POA: Diagnosis not present

## 2019-05-16 DIAGNOSIS — M62572 Muscle wasting and atrophy, not elsewhere classified, left ankle and foot: Secondary | ICD-10-CM | POA: Diagnosis not present

## 2019-05-18 DIAGNOSIS — K582 Mixed irritable bowel syndrome: Secondary | ICD-10-CM | POA: Diagnosis not present

## 2019-05-18 DIAGNOSIS — N3010 Interstitial cystitis (chronic) without hematuria: Secondary | ICD-10-CM | POA: Diagnosis not present

## 2019-05-18 DIAGNOSIS — M797 Fibromyalgia: Secondary | ICD-10-CM | POA: Diagnosis not present

## 2019-05-18 DIAGNOSIS — N94819 Vulvodynia, unspecified: Secondary | ICD-10-CM | POA: Diagnosis not present

## 2019-05-22 DIAGNOSIS — M25572 Pain in left ankle and joints of left foot: Secondary | ICD-10-CM | POA: Diagnosis not present

## 2019-05-22 DIAGNOSIS — M62572 Muscle wasting and atrophy, not elsewhere classified, left ankle and foot: Secondary | ICD-10-CM | POA: Diagnosis not present

## 2019-05-22 DIAGNOSIS — M797 Fibromyalgia: Secondary | ICD-10-CM | POA: Diagnosis not present

## 2019-05-22 DIAGNOSIS — M25672 Stiffness of left ankle, not elsewhere classified: Secondary | ICD-10-CM | POA: Diagnosis not present

## 2019-05-22 DIAGNOSIS — M62562 Muscle wasting and atrophy, not elsewhere classified, left lower leg: Secondary | ICD-10-CM | POA: Diagnosis not present

## 2019-05-22 DIAGNOSIS — R269 Unspecified abnormalities of gait and mobility: Secondary | ICD-10-CM | POA: Diagnosis not present

## 2019-05-25 DIAGNOSIS — M62562 Muscle wasting and atrophy, not elsewhere classified, left lower leg: Secondary | ICD-10-CM | POA: Diagnosis not present

## 2019-05-25 DIAGNOSIS — M797 Fibromyalgia: Secondary | ICD-10-CM | POA: Diagnosis not present

## 2019-05-25 DIAGNOSIS — M25672 Stiffness of left ankle, not elsewhere classified: Secondary | ICD-10-CM | POA: Diagnosis not present

## 2019-05-25 DIAGNOSIS — R269 Unspecified abnormalities of gait and mobility: Secondary | ICD-10-CM | POA: Diagnosis not present

## 2019-05-25 DIAGNOSIS — M62572 Muscle wasting and atrophy, not elsewhere classified, left ankle and foot: Secondary | ICD-10-CM | POA: Diagnosis not present

## 2019-05-25 DIAGNOSIS — M25572 Pain in left ankle and joints of left foot: Secondary | ICD-10-CM | POA: Diagnosis not present

## 2019-05-25 NOTE — Progress Notes (Signed)
 Office Visit Note  Patient: Rebecca Newton             Date of Birth: 10/01/1945           MRN: [id number]             PCP: Gates, Robert, MD Referring: Gates, Robert, MD Visit Date: 06/08/2019 Occupation: @GUAROCC@  Subjective:  Left foot pain  History of Present Illness: Rebecca Newton is a 72 y.o. female with history of fibromyalgia, osteoarthritis, and DDD.  Patient reports that her fibromyalgia has been manageable recently.  She continues to have chronic fatigue related to insomnia.  She takes Ambien 10 mg by mouth at bedtime to help her sleep.  She states that if she does not take Ambien she barely gets 3 hours of sleep at night. She has been followed by Dr. Regal for inflammatory fasciitis.  She has had cortisone injections and is currently wearing a boot.  She has been taking anti-inflammatories as needed.  She has also started going to physical therapy and has noticed some improvement.  Activities of Daily Living:  Patient reports morning stiffness for 2-3 hours.   Patient Reports nocturnal pain.  Difficulty dressing/grooming: Denies Difficulty climbing stairs: Reports Difficulty getting out of chair: Reports Difficulty using hands for taps, buttons, cutlery, and/or writing: Reports  Review of Systems  Constitutional: Positive for fatigue.  HENT: Negative for mouth sores, mouth dryness and nose dryness.   Eyes: Negative for pain, visual disturbance and dryness.  Respiratory: Negative for cough, hemoptysis, shortness of breath and difficulty breathing.   Cardiovascular: Negative for chest pain, palpitations, hypertension and swelling in legs/feet.  Gastrointestinal: Negative for blood in stool, constipation and diarrhea.  Endocrine: Negative for increased urination.  Genitourinary: Negative for painful urination.  Musculoskeletal: Positive for arthralgias and joint pain. Negative for joint swelling, myalgias, muscle weakness, morning stiffness, muscle  tenderness and myalgias.  Skin: Negative for color change, pallor, rash, hair loss, nodules/bumps, skin tightness, ulcers and sensitivity to sunlight.  Allergic/Immunologic: Negative for susceptible to infections.  Neurological: Negative for dizziness, numbness, headaches and weakness.  Hematological: Negative for swollen glands.  Psychiatric/Behavioral: Positive for sleep disturbance. Negative for depressed mood. The patient is not nervous/anxious.     PMFS History:  Patient Active Problem List   Diagnosis Date Noted  . Primary osteoarthritis of both knees 08/10/2017  . Fecal smearing 08/19/2016  . Rectocele 08/19/2016  . 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 Abdominal pain 10/14/2011  . Chronic fatigue syndrome 10/14/2011  . Chronic migraine without aura 10/14/2011  . Dysuria 10/14/2011  . Increased frequency of urination 10/14/2011  . Irritable bowel syndrome 10/14/2011  . Pain 10/14/2011  . Urinary urgency 10/14/2011  . Osteopenia   . Interstitial cystitis   . Fibromyalgia     Past Medical History:  Diagnosis Date  . Fibromyalgia   . Interstitial cystitis   . Lichen sclerosus   . Osteopenia 12/2015   T score -1.2 FRAX 14%/1.6% stable from prior DEXA    Family History  Problem Relation Age of Onset  . Lung cancer Mother   . Heart disease Mother   . Stroke Father   . Diabetes Maternal Grandmother   . Heart disease Maternal Grandmother   . Breast cancer Sister        Age 64   Past Surgical History:  Procedure Laterality Date  . BILATERAL SALPINGOOPHORECTOMY    . BREAST BIOPSY Right   . COMBINED HYSTEROSCOPY DIAGNOSTIC / D&C  2004  .   HYSTEROSCOPY    . LAPAROSCOPIC ASSISTED VAGINAL HYSTERECTOMY  2007   Leiomyoma/adenomyosis  . ROTATOR CUFF REPAIR  2006   Social History   Social History Narrative   Lives with spouse   Caffeine use: Drinks decaf passion tea   No soda   Right handed   Immunization History  Administered Date(s) Administered  . Zoster Recombinat  (Shingrix) 06/23/2017, 09/08/2017     Objective: Vital Signs: BP 119/71 (BP Location: Left Arm, Patient Position: Sitting, Cuff Size: Normal)   Pulse 61   Resp 16   Ht 5' 2" (1.575 m)   Wt 116 lb 6.4 oz (52.8 kg)   BMI 21.29 kg/m    Physical Exam Vitals signs and nursing note reviewed.  Constitutional:      Appearance: She is well-developed.  HENT:     Head: Normocephalic and atraumatic.  Eyes:     Conjunctiva/sclera: Conjunctivae normal.  Neck:     Musculoskeletal: Normal range of motion.  Cardiovascular:     Rate and Rhythm: Normal rate and regular rhythm.     Heart sounds: Normal heart sounds.  Pulmonary:     Effort: Pulmonary effort is normal.     Breath sounds: Normal breath sounds.  Abdominal:     General: Bowel sounds are normal.     Palpations: Abdomen is soft.  Lymphadenopathy:     Cervical: No cervical adenopathy.  Skin:    General: Skin is warm and dry.     Capillary Refill: Capillary refill takes less than 2 seconds.  Neurological:     Mental Status: She is alert and oriented to person, place, and time.  Psychiatric:        Behavior: Behavior normal.      Musculoskeletal Exam: C-spine, thoracic spine, lumbar spine good range of motion.  No midline spinal tenderness.  No SI joint tenderness.  Shoulder joints, elbow joints, wrist joints, MCPs, PIPs, DIPs good range of motion with no synovitis.  She has mild PIP and DIP synovial thickening consistent with osteoarthritis of both hands.  She has complete fist formation bilaterally.  Hip joints have limited range of motion.  Knee joints have good range of motion with no warmth or effusion.  No tenderness or swelling of ankle joints.  She has tenderness on the left forefoot and at the base of the left fifth metatarsal.  No inflammation was noted.  CDAI Exam: CDAI Score: - Patient Global: -; Provider Global: - Swollen: -; Tender: - Joint Exam   No joint exam has been documented for this visit   There is  currently no information documented on the homunculus. Go to the Rheumatology activity and complete the homunculus joint exam.  Investigation: No additional findings.  Imaging: No results found.  Recent Labs: Lab Results  Component Value Date   WBC 6.2 04/24/2010   HGB 14.2 04/24/2010   PLT 215 04/24/2010   NA 141 04/24/2010   K 3.6 04/24/2010   CL 111 04/24/2010   CO2 22 04/24/2010   GLUCOSE 99 04/24/2010   BUN 16 04/24/2010   CREATININE 0.83 04/24/2010   CALCIUM 9.0 04/24/2010   GFRAA  04/24/2010    >60        The eGFR has been calculated using the MDRD equation. This calculation has not been validated in all clinical situations. eGFR's persistently <60 mL/min signify possible Chronic Kidney Disease.    Speciality Comments: No specialty comments available.  Procedures:  No procedures performed Allergies: Macrodantin, Ciprofloxacin, Lyrica [pregabalin], and  Codeine   Assessment / Plan:     Visit Diagnoses: Fibromyalgia: Her fibromyalgia pain has been manageable recently.  She experiences occasional muscle aches and muscle tenderness exacerbated by strenuous physical exertion.  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  She has chronic fatigue related to insomnia.  She takes Ambien 10 mg by mouth at bedtime which helps her sleep at night.  She occasionally takes ibuprofen as needed for pain relief.  Primary insomnia -She takes Ambien 10 mg po at bedtime.  Other fatigue: She has chronic fatigue related to insomnia.  Lateral epicondylitis, right elbow: Resolved.  Primary osteoarthritis of both knees: She has good range of motion of bilateral knee joints.  No warmth or effusion was noted.  She declined cortisone and Visco gel injections at this time.  DDD (degenerative disc disease), cervical: She has good range of motion with no discomfort.  She has no symptoms of radiculopathy at this time.  Osteopenia of multiple  sites: Future order for DEXA was placed by Dr. Phineas Real.  Pain in left foot: She has tenderness on the left forefoot and at the base of the left fifth metatarsal.  Loss of fat pad noted. She has been followed by Dr. Paulla Dolly.  She has had cortisone injections which have provided temporary relief.  She is currently wearing a boot and going to physical therapy.  We discussed the importance of wearing proper fitting shoes when she transitions from the boot to regular tennis shoes.  Other medical conditions are listed as follows  Interstitial cystitis  History of IBS  History of migraine  Orders: No orders of the defined types were placed in this encounter.  No orders of the defined types were placed in this encounter.    Follow-Up Instructions: Return in about 1 year (around 06/07/2020) for Fibromyalgia, Osteoarthritis.   Ofilia Neas, PA-C  Note - This record has been created using Dragon software.  Chart creation errors have been sought, but may not always  have been located. Such creation errors do not reflect on  the standard of medical care.

## 2019-05-31 DIAGNOSIS — M62572 Muscle wasting and atrophy, not elsewhere classified, left ankle and foot: Secondary | ICD-10-CM | POA: Diagnosis not present

## 2019-05-31 DIAGNOSIS — M62562 Muscle wasting and atrophy, not elsewhere classified, left lower leg: Secondary | ICD-10-CM | POA: Diagnosis not present

## 2019-05-31 DIAGNOSIS — M25572 Pain in left ankle and joints of left foot: Secondary | ICD-10-CM | POA: Diagnosis not present

## 2019-05-31 DIAGNOSIS — M25672 Stiffness of left ankle, not elsewhere classified: Secondary | ICD-10-CM | POA: Diagnosis not present

## 2019-05-31 DIAGNOSIS — R269 Unspecified abnormalities of gait and mobility: Secondary | ICD-10-CM | POA: Diagnosis not present

## 2019-05-31 DIAGNOSIS — M797 Fibromyalgia: Secondary | ICD-10-CM | POA: Diagnosis not present

## 2019-06-05 ENCOUNTER — Telehealth: Payer: Self-pay | Admitting: *Deleted

## 2019-06-05 NOTE — Telephone Encounter (Signed)
Pt called requesting that we let her know her results of her upcoming Mammogram as soon as we know upcoming appt 06/08/2019

## 2019-06-06 DIAGNOSIS — M25672 Stiffness of left ankle, not elsewhere classified: Secondary | ICD-10-CM | POA: Diagnosis not present

## 2019-06-06 DIAGNOSIS — M25572 Pain in left ankle and joints of left foot: Secondary | ICD-10-CM | POA: Diagnosis not present

## 2019-06-06 DIAGNOSIS — M797 Fibromyalgia: Secondary | ICD-10-CM | POA: Diagnosis not present

## 2019-06-06 DIAGNOSIS — M62572 Muscle wasting and atrophy, not elsewhere classified, left ankle and foot: Secondary | ICD-10-CM | POA: Diagnosis not present

## 2019-06-06 DIAGNOSIS — M62562 Muscle wasting and atrophy, not elsewhere classified, left lower leg: Secondary | ICD-10-CM | POA: Diagnosis not present

## 2019-06-06 DIAGNOSIS — R269 Unspecified abnormalities of gait and mobility: Secondary | ICD-10-CM | POA: Diagnosis not present

## 2019-06-07 ENCOUNTER — Ambulatory Visit: Payer: Medicare Other | Admitting: Podiatry

## 2019-06-08 ENCOUNTER — Ambulatory Visit
Admission: RE | Admit: 2019-06-08 | Discharge: 2019-06-08 | Disposition: A | Discharge status: Home / Self Care | Payer: Medicare Other | Source: Ambulatory Visit | Attending: Gynecology | Admitting: Gynecology

## 2019-06-08 ENCOUNTER — Encounter: Payer: Self-pay | Admitting: Physician Assistant

## 2019-06-08 ENCOUNTER — Other Ambulatory Visit: Payer: Self-pay

## 2019-06-08 ENCOUNTER — Ambulatory Visit (INDEPENDENT_AMBULATORY_CARE_PROVIDER_SITE_OTHER): Payer: Medicare Other | Admitting: Physician Assistant

## 2019-06-08 VITALS — BP 119/71 | HR 61 | Resp 16 | Ht 62.0 in | Wt 116.4 lb

## 2019-06-08 DIAGNOSIS — Z8719 Personal history of other diseases of the digestive system: Secondary | ICD-10-CM | POA: Diagnosis not present

## 2019-06-08 DIAGNOSIS — M62562 Muscle wasting and atrophy, not elsewhere classified, left lower leg: Secondary | ICD-10-CM | POA: Diagnosis not present

## 2019-06-08 DIAGNOSIS — M170 Bilateral primary osteoarthritis of knee: Secondary | ICD-10-CM

## 2019-06-08 DIAGNOSIS — M8589 Other specified disorders of bone density and structure, multiple sites: Secondary | ICD-10-CM | POA: Diagnosis not present

## 2019-06-08 DIAGNOSIS — Z8669 Personal history of other diseases of the nervous system and sense organs: Secondary | ICD-10-CM

## 2019-06-08 DIAGNOSIS — M797 Fibromyalgia: Secondary | ICD-10-CM | POA: Diagnosis not present

## 2019-06-08 DIAGNOSIS — Z1231 Encounter for screening mammogram for malignant neoplasm of breast: Secondary | ICD-10-CM

## 2019-06-08 DIAGNOSIS — M25572 Pain in left ankle and joints of left foot: Secondary | ICD-10-CM | POA: Diagnosis not present

## 2019-06-08 DIAGNOSIS — R5383 Other fatigue: Secondary | ICD-10-CM

## 2019-06-08 DIAGNOSIS — N3010 Interstitial cystitis (chronic) without hematuria: Secondary | ICD-10-CM | POA: Diagnosis not present

## 2019-06-08 DIAGNOSIS — R269 Unspecified abnormalities of gait and mobility: Secondary | ICD-10-CM | POA: Diagnosis not present

## 2019-06-08 DIAGNOSIS — F5101 Primary insomnia: Secondary | ICD-10-CM | POA: Diagnosis not present

## 2019-06-08 DIAGNOSIS — M5030 Other cervical disc degeneration, unspecified cervical region: Secondary | ICD-10-CM | POA: Diagnosis not present

## 2019-06-08 DIAGNOSIS — M79672 Pain in left foot: Secondary | ICD-10-CM

## 2019-06-08 DIAGNOSIS — M7711 Lateral epicondylitis, right elbow: Secondary | ICD-10-CM | POA: Diagnosis not present

## 2019-06-08 DIAGNOSIS — M62572 Muscle wasting and atrophy, not elsewhere classified, left ankle and foot: Secondary | ICD-10-CM | POA: Diagnosis not present

## 2019-06-08 DIAGNOSIS — M25672 Stiffness of left ankle, not elsewhere classified: Secondary | ICD-10-CM | POA: Diagnosis not present

## 2019-06-12 ENCOUNTER — Other Ambulatory Visit: Payer: Self-pay

## 2019-06-12 ENCOUNTER — Encounter: Payer: Self-pay | Admitting: Podiatry

## 2019-06-12 ENCOUNTER — Ambulatory Visit (INDEPENDENT_AMBULATORY_CARE_PROVIDER_SITE_OTHER): Payer: Medicare Other | Admitting: Podiatry

## 2019-06-12 DIAGNOSIS — M797 Fibromyalgia: Secondary | ICD-10-CM | POA: Diagnosis not present

## 2019-06-12 DIAGNOSIS — M25571 Pain in right ankle and joints of right foot: Secondary | ICD-10-CM

## 2019-06-12 DIAGNOSIS — M25572 Pain in left ankle and joints of left foot: Secondary | ICD-10-CM | POA: Diagnosis not present

## 2019-06-12 DIAGNOSIS — M25672 Stiffness of left ankle, not elsewhere classified: Secondary | ICD-10-CM | POA: Diagnosis not present

## 2019-06-12 DIAGNOSIS — M62572 Muscle wasting and atrophy, not elsewhere classified, left ankle and foot: Secondary | ICD-10-CM | POA: Diagnosis not present

## 2019-06-12 DIAGNOSIS — R269 Unspecified abnormalities of gait and mobility: Secondary | ICD-10-CM | POA: Diagnosis not present

## 2019-06-12 DIAGNOSIS — M62562 Muscle wasting and atrophy, not elsewhere classified, left lower leg: Secondary | ICD-10-CM | POA: Diagnosis not present

## 2019-06-12 DIAGNOSIS — M79672 Pain in left foot: Secondary | ICD-10-CM | POA: Diagnosis not present

## 2019-06-12 DIAGNOSIS — M722 Plantar fascial fibromatosis: Secondary | ICD-10-CM

## 2019-06-13 ENCOUNTER — Telehealth: Payer: Self-pay | Admitting: Podiatry

## 2019-06-13 NOTE — Telephone Encounter (Signed)
Button on the boot for inflate or deflate has broken completley off does she neeed to come back to get another #call back [id number]

## 2019-06-13 NOTE — Telephone Encounter (Signed)
Left message informing pt she could come in and we would replace the boot and bill her insurance.

## 2019-06-14 NOTE — Telephone Encounter (Signed)
Pt presented to office and asked R. Amalia Hailey - receptionist to let her speak to the office manager, billing and me concerning her boot. Mattie Marlin came to me. Pt states she would like to speak to me in private. I took pt to the 2 chairs in the pedorthist waiting area. Pt states she didn't know what was going on in my life and I didn't know what was going on in hers, and she played her phone message from me 06/13/2019 5:01pm. Pt then showed me that she had 2 cam boots, one that would not pump up and the new one that had the valve knob broken off. I informed pt that I was not informed, that she had a new boot, that had broken, and protocol was to charge insurance for the replacement boot. I told pt that under the circumstances I felt the office would replace the cam boot without charge. I gave pt another small cam boot. Pt stated I had given her 5 stars service and then she asked who cuts my hair.

## 2019-06-14 NOTE — Progress Notes (Signed)
Subjective:   Patient ID: Rebecca Newton, female   DOB: 73 y.o.   MRN: [id number]   HPI Patient states she seems to be improving with physical therapy but states at times the boot is bothersome even though it has been helpful   ROS      Objective:  Physical Exam  Neuro vascular status intact with patient's left foot doing well with boot in place with no other significant pathology and reduction of pain with physical therapy     Assessment:  Appears to be improving     Plan:  Reviewed the continuation of conservative care and boot usage and physical therapy and will be seen back depending on symptoms but hopeful improvement is being made

## 2019-06-19 DIAGNOSIS — M25672 Stiffness of left ankle, not elsewhere classified: Secondary | ICD-10-CM | POA: Diagnosis not present

## 2019-06-19 DIAGNOSIS — M797 Fibromyalgia: Secondary | ICD-10-CM | POA: Diagnosis not present

## 2019-06-19 DIAGNOSIS — M62562 Muscle wasting and atrophy, not elsewhere classified, left lower leg: Secondary | ICD-10-CM | POA: Diagnosis not present

## 2019-06-19 DIAGNOSIS — M25572 Pain in left ankle and joints of left foot: Secondary | ICD-10-CM | POA: Diagnosis not present

## 2019-06-19 DIAGNOSIS — R269 Unspecified abnormalities of gait and mobility: Secondary | ICD-10-CM | POA: Diagnosis not present

## 2019-06-19 DIAGNOSIS — M62572 Muscle wasting and atrophy, not elsewhere classified, left ankle and foot: Secondary | ICD-10-CM | POA: Diagnosis not present

## 2019-06-26 DIAGNOSIS — M25672 Stiffness of left ankle, not elsewhere classified: Secondary | ICD-10-CM | POA: Diagnosis not present

## 2019-06-26 DIAGNOSIS — R269 Unspecified abnormalities of gait and mobility: Secondary | ICD-10-CM | POA: Diagnosis not present

## 2019-06-26 DIAGNOSIS — M62572 Muscle wasting and atrophy, not elsewhere classified, left ankle and foot: Secondary | ICD-10-CM | POA: Diagnosis not present

## 2019-06-26 DIAGNOSIS — M25572 Pain in left ankle and joints of left foot: Secondary | ICD-10-CM | POA: Diagnosis not present

## 2019-06-26 DIAGNOSIS — M62562 Muscle wasting and atrophy, not elsewhere classified, left lower leg: Secondary | ICD-10-CM | POA: Diagnosis not present

## 2019-06-26 DIAGNOSIS — M797 Fibromyalgia: Secondary | ICD-10-CM | POA: Diagnosis not present

## 2019-06-28 DIAGNOSIS — R269 Unspecified abnormalities of gait and mobility: Secondary | ICD-10-CM | POA: Diagnosis not present

## 2019-06-28 DIAGNOSIS — M797 Fibromyalgia: Secondary | ICD-10-CM | POA: Diagnosis not present

## 2019-06-28 DIAGNOSIS — M25572 Pain in left ankle and joints of left foot: Secondary | ICD-10-CM | POA: Diagnosis not present

## 2019-06-28 DIAGNOSIS — M62572 Muscle wasting and atrophy, not elsewhere classified, left ankle and foot: Secondary | ICD-10-CM | POA: Diagnosis not present

## 2019-06-28 DIAGNOSIS — M62562 Muscle wasting and atrophy, not elsewhere classified, left lower leg: Secondary | ICD-10-CM | POA: Diagnosis not present

## 2019-06-28 DIAGNOSIS — M25672 Stiffness of left ankle, not elsewhere classified: Secondary | ICD-10-CM | POA: Diagnosis not present

## 2019-07-13 ENCOUNTER — Encounter: Payer: Self-pay | Admitting: Podiatry

## 2019-07-13 ENCOUNTER — Ambulatory Visit (INDEPENDENT_AMBULATORY_CARE_PROVIDER_SITE_OTHER): Payer: Medicare Other | Admitting: Podiatry

## 2019-07-13 ENCOUNTER — Other Ambulatory Visit: Payer: Self-pay

## 2019-07-13 DIAGNOSIS — M722 Plantar fascial fibromatosis: Secondary | ICD-10-CM

## 2019-07-13 DIAGNOSIS — M79672 Pain in left foot: Secondary | ICD-10-CM

## 2019-07-14 NOTE — Progress Notes (Signed)
Subjective:   Patient ID: Rebecca Newton, female   DOB: 74 y.o.   MRN: [id number]   HPI Patient states my foot is feeling quite a bit better currently and physical therapy is helping   ROS      Objective:  Physical Exam  Neurovascular status intact with patient found to have significant diminishment of the plantar fascial condition left with diminished fat pad noted     Assessment:  Doing well post fascial condition left with reduced pain     Plan:  Reviewed the importance of supportive shoes with thick heels to try to keep pressure off this area and elevation.  Also discussed shoe gear modifications and reviewed what we may have to do if symptoms persist and will finish physical therapy over the next several weeks

## 2019-08-17 DIAGNOSIS — Z23 Encounter for immunization: Secondary | ICD-10-CM | POA: Diagnosis not present

## 2019-08-25 ENCOUNTER — Ambulatory Visit: Payer: Medicare Other

## 2019-08-31 ENCOUNTER — Ambulatory Visit: Payer: Medicare Other | Attending: Internal Medicine

## 2019-08-31 DIAGNOSIS — Z23 Encounter for immunization: Secondary | ICD-10-CM

## 2019-08-31 NOTE — Progress Notes (Signed)
   Covid-19 Vaccination Clinic  Name:  Rebecca Newton    MRN: [id number] DOB: 1946/03/13  08/31/2019  [name] was observed post Covid-19 immunization for 15 minutes without incidence. She was provided with Vaccine Information Sheet and instruction to access the V-Safe system.   [name] was instructed to call 911 with any severe reactions post vaccine: Marland Kitchen Difficulty breathing  . Swelling of your face and throat  . A fast heartbeat  . A bad rash all over your body  . Dizziness and weakness    Immunizations Administered    Name Date Dose VIS Date Route   Pfizer COVID-19 Vaccine 08/31/2019  4:10 PM 0.3 mL 07/07/2019 Intramuscular   Manufacturer: [place]   Lot: [id number]   [place]: [id number]

## 2019-09-15 ENCOUNTER — Ambulatory Visit: Payer: Medicare Other

## 2019-09-19 ENCOUNTER — Other Ambulatory Visit: Payer: Self-pay | Admitting: Obstetrics and Gynecology

## 2019-09-19 DIAGNOSIS — Z1231 Encounter for screening mammogram for malignant neoplasm of breast: Secondary | ICD-10-CM

## 2019-09-26 ENCOUNTER — Ambulatory Visit: Payer: Medicare Other | Attending: Internal Medicine

## 2019-09-26 DIAGNOSIS — Z23 Encounter for immunization: Secondary | ICD-10-CM | POA: Insufficient documentation

## 2019-09-26 NOTE — Progress Notes (Signed)
   Covid-19 Vaccination Clinic  Name:  Rebecca Newton    MRN: [id number] DOB: Aug 13, 1945  09/26/2019  Rebecca Newton was observed post Covid-19 immunization for 15 minutes without incident. She was provided with Vaccine Information Sheet and instruction to access the V-Safe system.   Rebecca Newton was instructed to call 911 with any severe reactions post vaccine: Marland Kitchen Difficulty breathing  . Swelling of face and throat  . A fast heartbeat  . A bad rash all over body  . Dizziness and weakness   Immunizations Administered    Name Date Dose VIS Date Route   Pfizer COVID-19 Vaccine 09/26/2019  8:45 AM 0.3 mL 07/07/2019 Intramuscular   Manufacturer: [place]   Lot: [id number]   [place]: [id number]

## 2020-01-01 DIAGNOSIS — E7800 Pure hypercholesterolemia, unspecified: Secondary | ICD-10-CM | POA: Diagnosis not present

## 2020-01-01 DIAGNOSIS — G43009 Migraine without aura, not intractable, without status migrainosus: Secondary | ICD-10-CM | POA: Diagnosis not present

## 2020-01-01 DIAGNOSIS — R7303 Prediabetes: Secondary | ICD-10-CM | POA: Diagnosis not present

## 2020-01-01 DIAGNOSIS — K219 Gastro-esophageal reflux disease without esophagitis: Secondary | ICD-10-CM | POA: Diagnosis not present

## 2020-01-01 DIAGNOSIS — M797 Fibromyalgia: Secondary | ICD-10-CM | POA: Diagnosis not present

## 2020-01-01 DIAGNOSIS — F439 Reaction to severe stress, unspecified: Secondary | ICD-10-CM | POA: Diagnosis not present

## 2020-01-01 DIAGNOSIS — E559 Vitamin D deficiency, unspecified: Secondary | ICD-10-CM | POA: Diagnosis not present

## 2020-01-01 DIAGNOSIS — G4700 Insomnia, unspecified: Secondary | ICD-10-CM | POA: Diagnosis not present

## 2020-01-01 DIAGNOSIS — M8580 Other specified disorders of bone density and structure, unspecified site: Secondary | ICD-10-CM | POA: Diagnosis not present

## 2020-01-01 DIAGNOSIS — R7309 Other abnormal glucose: Secondary | ICD-10-CM | POA: Diagnosis not present

## 2020-01-01 DIAGNOSIS — Z79899 Other long term (current) drug therapy: Secondary | ICD-10-CM | POA: Diagnosis not present

## 2020-01-01 DIAGNOSIS — F411 Generalized anxiety disorder: Secondary | ICD-10-CM | POA: Diagnosis not present

## 2020-01-01 DIAGNOSIS — B373 Candidiasis of vulva and vagina: Secondary | ICD-10-CM | POA: Diagnosis not present

## 2020-04-16 ENCOUNTER — Encounter: Payer: Medicare Other | Admitting: Obstetrics and Gynecology

## 2020-04-17 ENCOUNTER — Ambulatory Visit (INDEPENDENT_AMBULATORY_CARE_PROVIDER_SITE_OTHER): Payer: Medicare Other | Admitting: Obstetrics and Gynecology

## 2020-04-17 ENCOUNTER — Encounter: Payer: Medicare Other | Admitting: Obstetrics and Gynecology

## 2020-04-17 ENCOUNTER — Encounter: Payer: Self-pay | Admitting: Obstetrics and Gynecology

## 2020-04-17 ENCOUNTER — Other Ambulatory Visit: Payer: Self-pay

## 2020-04-17 VITALS — BP 124/76 | Ht 62.0 in | Wt 115.0 lb

## 2020-04-17 DIAGNOSIS — H0102B Squamous blepharitis left eye, upper and lower eyelids: Secondary | ICD-10-CM | POA: Diagnosis not present

## 2020-04-17 DIAGNOSIS — Z01419 Encounter for gynecological examination (general) (routine) without abnormal findings: Secondary | ICD-10-CM

## 2020-04-17 DIAGNOSIS — H16223 Keratoconjunctivitis sicca, not specified as Sjogren's, bilateral: Secondary | ICD-10-CM | POA: Diagnosis not present

## 2020-04-17 DIAGNOSIS — M8588 Other specified disorders of bone density and structure, other site: Secondary | ICD-10-CM

## 2020-04-17 DIAGNOSIS — Z961 Presence of intraocular lens: Secondary | ICD-10-CM | POA: Diagnosis not present

## 2020-04-17 DIAGNOSIS — H40013 Open angle with borderline findings, low risk, bilateral: Secondary | ICD-10-CM | POA: Diagnosis not present

## 2020-04-17 DIAGNOSIS — E119 Type 2 diabetes mellitus without complications: Secondary | ICD-10-CM | POA: Diagnosis not present

## 2020-04-17 DIAGNOSIS — H0102A Squamous blepharitis right eye, upper and lower eyelids: Secondary | ICD-10-CM | POA: Diagnosis not present

## 2020-04-17 NOTE — Progress Notes (Signed)
   Rebecca Newton [birth year] [id number]  SUBJECTIVE:  74 y.o. G1P1001 female here for a breast and pelvic exam and Pap smear. She has no gynecologic concerns.   Current Outpatient Medications  Medication Sig Dispense Refill  . diazepam (VALIUM) 10 MG tablet Take 10 mg by mouth 3 times/day as needed-between meals & bedtime for anxiety.    Marland Kitchen doxepin (SINEQUAN) 10 MG capsule as needed.   2  . metFORMIN (GLUCOPHAGE) 500 MG tablet Take 0.5 tablets by mouth 2 (two) times daily.  3  . rosuvastatin (CRESTOR) 20 MG tablet Take 20 mg by mouth daily.    Marland Kitchen zolpidem (AMBIEN) 10 MG tablet Take 10 mg by mouth at bedtime as needed.      Marland Kitchen levofloxacin (LEVAQUIN) 500 MG tablet Take by mouth as needed. (Patient not taking: Reported on 04/17/2020)     No current facility-administered medications for this visit.   Allergies: Macrodantin, Ciprofloxacin, Lyrica [pregabalin], and Codeine  No LMP recorded. Patient has had a hysterectomy.  Past medical history,surgical history, problem list, medications, allergies, family history and social history were all reviewed and documented as reviewed in the EPIC chart.  GYN ROS: no abnormal bleeding, pelvic pain or discharge, no breast pain or new or enlarging lumps on self exam.  No dysuria, frequency, burning, pain with urination, cloudy/malodorous urine.   OBJECTIVE:  BP 124/76   Ht 5\' 2"  (1.575 m)   Wt 115 lb (52.2 kg)   BMI 21.03 kg/m  The patient appears well, alert, oriented, in no distress.  BREAST EXAM: breasts appear normal, no suspicious masses, no skin or nipple changes or axillary nodes  PELVIC EXAM: VULVA: normal appearing vulva with atrophic change, no masses, tenderness or lesions, VAGINA: normal appearing vagina with atrophic change, normal color and discharge, no lesions, mild rectocele.  1, CERVIX: surgically absent, UTERUS: surgically absent, vaginal cuff normal, ADNEXA: no masses, nontender  Chaperone: Caryn Bee present during the  examination  ASSESSMENT:  74 y.o. G1P1001 here for a breast and pelvic exam  PLAN:   1. Postmenopausal. Prior LAVH BSO in 2007 for adenomyosis and leiomyoma.  No significant hot flashes or night sweats.  No vaginal bleeding. 2. Rectocele. 2. Pap smear 08/2017.  History cryosurgery over 30 years ago.  Will readdress at her annual visits if she does desire to continue Pap smear screening as there is an option to discontinue surveillance based on her age and prior hysterectomy. 3. Mammogram 05/2019.  Normal breast exam today.  She is reminded to schedule an annual mammogram this year when due. 4. Colonoscopy 2011.  She will follow up at the interval recommended by her GI specialist.  Says she is working on this with her primary doctor to get another one scheduled. 5. Osteopenia.  DEXA 2017 and did not follow up last year.  T score -1.2 at that time.  Next DEXA recommended now.  She will think about whether she wants to do this as she says that she would not be interested in treatments if osteoporotic.  Test is ordered if she wants to go ahead and schedule. 6. Health maintenance.  No labs today as she normally has these completed elsewhere.  Return annually or sooner, prn.  Joseph Pierini MD 04/17/20

## 2020-05-15 DIAGNOSIS — Z23 Encounter for immunization: Secondary | ICD-10-CM | POA: Diagnosis not present

## 2020-05-22 NOTE — Progress Notes (Signed)
Office Visit Note  Patient: Rebecca Newton             Date of Birth: 05-08-1946           MRN: [id number]             PCP: Josetta Huddle, MD Referring: Josetta Huddle, MD Visit Date: 06/05/2020 Occupation: '@GUAROCC' @  Subjective:  Pain in both arms  History of Present Illness: Rebecca Newton is a 74 y.o. female with history of fibromyalgia, osteoarthritis, and DDD.  She denies any recent fibromyalgia flares.  She continues to have chronic fatigue secondary to insomnia.  She only sleeps 3 to 4 hours per night.  She experiences intermittent myalgias and muscle tenderness in both upper extremities.  She acts as the caregiver for her husband, and she attributes some of this discomfort to lifting/assisting him.  She denies any increased neck or lower back pain.  She is not experiencing any trapezius muscle tension and muscle tenderness at this time.  She denies any joint swelling currently.   Activities of Daily Living:  Patient reports morning stiffness for 0 minutes.   Patient Denies nocturnal pain.  Difficulty dressing/grooming: Denies Difficulty climbing stairs: Denies Difficulty getting out of chair: Denies Difficulty using hands for taps, buttons, cutlery, and/or writing: Denies  Review of Systems  Constitutional: Negative for fatigue.  HENT: Negative for mouth sores, mouth dryness and nose dryness.   Eyes: Negative for pain, itching, visual disturbance and dryness.  Respiratory: Negative for cough, hemoptysis, shortness of breath and difficulty breathing.   Cardiovascular: Negative for chest pain, palpitations, hypertension and swelling in legs/feet.  Gastrointestinal: Negative for blood in stool, constipation and diarrhea.  Endocrine: Negative for increased urination.  Genitourinary: Negative for difficulty urinating and painful urination.  Musculoskeletal: Negative for arthralgias, joint pain, joint swelling, myalgias, muscle weakness, morning stiffness, muscle  tenderness and myalgias.  Skin: Negative for color change, pallor, rash, hair loss, nodules/bumps, redness, skin tightness, ulcers and sensitivity to sunlight.  Allergic/Immunologic: Negative for susceptible to infections.  Neurological: Positive for headaches. Negative for dizziness, numbness, memory loss and weakness.  Hematological: Negative for bruising/bleeding tendency and swollen glands.  Psychiatric/Behavioral: Negative for depressed mood, confusion and sleep disturbance. The patient is not nervous/anxious.     PMFS History:  Patient Active Problem List   Diagnosis Date Noted  . Primary osteoarthritis of both knees 08/10/2017  . Fecal smearing 08/19/2016  . Rectocele 08/19/2016  . 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 Abdominal pain 10/14/2011  . Chronic fatigue syndrome 10/14/2011  . Chronic migraine without aura 10/14/2011  . Dysuria 10/14/2011  . Increased frequency of urination 10/14/2011  . Irritable bowel syndrome 10/14/2011  . Pain 10/14/2011  . Urinary urgency 10/14/2011  . Osteopenia   . Interstitial cystitis   . Fibromyalgia     Past Medical History:  Diagnosis Date  . Fibromyalgia   . Interstitial cystitis   . Lichen sclerosus   . Osteopenia 12/2015   T score -1.2 FRAX 14%/1.6% stable from prior DEXA    Family History  Problem Relation Age of Onset  . Lung cancer Mother   . Heart disease Mother   . Stroke Father   . Diabetes Maternal Grandmother   . Heart disease Maternal Grandmother   . Breast cancer Sister        Age 78  . Cancer Son        Lung cancer   Past Surgical History:  Procedure Laterality Date  . BILATERAL SALPINGOOPHORECTOMY    .  BREAST BIOPSY Right   . COMBINED HYSTEROSCOPY DIAGNOSTIC / D&C  2004  . HYSTEROSCOPY    . LAPAROSCOPIC ASSISTED VAGINAL HYSTERECTOMY  2007   Leiomyoma/adenomyosis  . ROTATOR CUFF REPAIR  2006   Social History   Social History Narrative   Lives with spouse   Caffeine use: Drinks decaf passion tea   No  soda   Right handed   Immunization History  Administered Date(s) Administered  . PFIZER SARS-COV-2 Vaccination 08/31/2019, 09/26/2019  . Zoster Recombinat (Shingrix) 06/23/2017, 09/08/2017     Objective: Vital Signs: BP 119/77 (BP Location: Right Arm, Patient Position: Sitting, Cuff Size: Normal)   Pulse 69   Resp 14   Ht '5\' 2"'  (1.575 m)   Wt 112 lb (50.8 kg)   BMI 20.49 kg/m    Physical Exam Vitals and nursing note reviewed.  Constitutional:      Appearance: She is well-developed.  HENT:     Head: Normocephalic and atraumatic.  Eyes:     Conjunctiva/sclera: Conjunctivae normal.  Pulmonary:     Effort: Pulmonary effort is normal.  Abdominal:     Palpations: Abdomen is soft.  Musculoskeletal:     Cervical back: Normal range of motion.  Skin:    General: Skin is warm and dry.     Capillary Refill: Capillary refill takes less than 2 seconds.  Neurological:     Mental Status: She is alert and oriented to person, place, and time.  Psychiatric:        Behavior: Behavior normal.      Musculoskeletal Exam: C-spine good ROM.  No trapezius muscle tension and tenderness.  Shoulder joints, elbow joints, wrist joints, MCPs, PIPs, and DIPs good ROM with no synovitis.  Complete fist formation bilaterally.  Knee joints good ROM with no warmth or effusion.  Ankle joints good ROM with no tenderness or inflammation.    CDAI Exam: CDAI Score: -- Patient Global: --; Provider Global: -- Swollen: --; Tender: -- Joint Exam 06/05/2020   No joint exam has been documented for this visit   There is currently no information documented on the homunculus. Go to the Rheumatology activity and complete the homunculus joint exam.  Investigation: No additional findings.  Imaging: No results found.  Recent Labs: Lab Results  Component Value Date   WBC 6.2 04/24/2010   HGB 14.2 04/24/2010   PLT 215 04/24/2010   NA 141 04/24/2010   K 3.6 04/24/2010   CL 111 04/24/2010   CO2 22  04/24/2010   GLUCOSE 99 04/24/2010   BUN 16 04/24/2010   CREATININE 0.83 04/24/2010   CALCIUM 9.0 04/24/2010   GFRAA  04/24/2010    >60        The eGFR has been calculated using the MDRD equation. This calculation has not been validated in all clinical situations. eGFR's persistently <60 mL/min signify possible Chronic Kidney Disease.    Speciality Comments: No specialty comments available.  Procedures:  No procedures performed Allergies: Macrodantin, Ciprofloxacin, Lyrica [pregabalin], and Codeine   Assessment / Plan:     Visit Diagnoses: Fibromyalgia: She has not had any recent fibromyalgia flares.  She has intermittent myalgias and muscle tenderness in bilateral upper extremities.  She has tenderness at the deltoid insertion site bilaterally.  No trapezius muscle tension and muscle tenderness noted on exam.  She continues to have chronic fatigue secondary to insomnia.  She has only been sleeping 3 to 4 hours per night.  She acts as the primary caregiver for her husband,  which causes a tremendous amount of stress.  We discussed the importance of self-care.  We also discussed the importance of regular exercise.  She was advised to notify us if she develops any new or worsening symptoms.  She will follow-up in the office in 1 year.  Other fatigue: Chronic and secondary to insomnia.  Discussed the importance of regular exercise.   Primary insomnia - She takes Ambien 10 mg 1 tablet by mouth at bedtime as needed for insomnia.  She has been sleeping 3-4 hours per night.     Lateral epicondylitis, right elbow - Resolved.  Primary osteoarthritis of both knees: She has good range of motion of both knee joints with no discomfort.  No warmth or effusion was noted on examination today.  No knee crepitus was noted.  DDD (degenerative disc disease), cervical: She has good range of motion of the C-spine with no discomfort.  No trapezius muscle tension or tenderness noted.  Osteopenia of  multiple sites - Future order for DEXA was placed by Dr. Delilah Shan.   Pain in left foot -Resolved. Evaluated by Dr. Paulla Dolly in the past.   Other medical conditions are listed as follows:   Interstitial cystitis  History of migraine  History of IBS  Orders: No orders of the defined types were placed in this encounter.  No orders of the defined types were placed in this encounter.     Follow-Up Instructions: Return in about 1 year (around 06/05/2021) for Fibromyalgia, Osteoarthritis, DDD.   Ofilia Neas, PA-C  Note - This record has been created using Dragon software.  Chart creation errors have been sought, but may not always  have been located. Such creation errors do not reflect on  the standard of medical care.

## 2020-06-04 ENCOUNTER — Ambulatory Visit: Payer: Medicare Other | Admitting: Rheumatology

## 2020-06-05 ENCOUNTER — Other Ambulatory Visit: Payer: Self-pay

## 2020-06-05 ENCOUNTER — Encounter: Payer: Self-pay | Admitting: Physician Assistant

## 2020-06-05 ENCOUNTER — Ambulatory Visit (INDEPENDENT_AMBULATORY_CARE_PROVIDER_SITE_OTHER): Payer: Medicare Other | Admitting: Physician Assistant

## 2020-06-05 ENCOUNTER — Ambulatory Visit: Payer: Medicare Other | Admitting: Rheumatology

## 2020-06-05 VITALS — BP 119/77 | HR 69 | Resp 14 | Ht 62.0 in | Wt 112.0 lb

## 2020-06-05 DIAGNOSIS — M797 Fibromyalgia: Secondary | ICD-10-CM

## 2020-06-05 DIAGNOSIS — F5101 Primary insomnia: Secondary | ICD-10-CM | POA: Diagnosis not present

## 2020-06-05 DIAGNOSIS — M170 Bilateral primary osteoarthritis of knee: Secondary | ICD-10-CM | POA: Diagnosis not present

## 2020-06-05 DIAGNOSIS — M79672 Pain in left foot: Secondary | ICD-10-CM | POA: Diagnosis not present

## 2020-06-05 DIAGNOSIS — N3010 Interstitial cystitis (chronic) without hematuria: Secondary | ICD-10-CM

## 2020-06-05 DIAGNOSIS — R5383 Other fatigue: Secondary | ICD-10-CM | POA: Diagnosis not present

## 2020-06-05 DIAGNOSIS — M7711 Lateral epicondylitis, right elbow: Secondary | ICD-10-CM

## 2020-06-05 DIAGNOSIS — Z8719 Personal history of other diseases of the digestive system: Secondary | ICD-10-CM | POA: Diagnosis not present

## 2020-06-05 DIAGNOSIS — M5030 Other cervical disc degeneration, unspecified cervical region: Secondary | ICD-10-CM | POA: Diagnosis not present

## 2020-06-05 DIAGNOSIS — Z8669 Personal history of other diseases of the nervous system and sense organs: Secondary | ICD-10-CM | POA: Diagnosis not present

## 2020-06-05 DIAGNOSIS — M8589 Other specified disorders of bone density and structure, multiple sites: Secondary | ICD-10-CM

## 2020-06-10 ENCOUNTER — Other Ambulatory Visit: Payer: Self-pay

## 2020-06-10 ENCOUNTER — Ambulatory Visit
Admission: RE | Admit: 2020-06-10 | Discharge: 2020-06-10 | Disposition: A | Discharge status: Home / Self Care | Payer: Medicare Other | Source: Ambulatory Visit | Attending: Obstetrics and Gynecology | Admitting: Obstetrics and Gynecology

## 2020-06-10 DIAGNOSIS — Z1231 Encounter for screening mammogram for malignant neoplasm of breast: Secondary | ICD-10-CM

## 2020-07-12 DIAGNOSIS — L509 Urticaria, unspecified: Secondary | ICD-10-CM | POA: Diagnosis not present

## 2020-07-12 DIAGNOSIS — F439 Reaction to severe stress, unspecified: Secondary | ICD-10-CM | POA: Diagnosis not present

## 2020-07-12 DIAGNOSIS — G43009 Migraine without aura, not intractable, without status migrainosus: Secondary | ICD-10-CM | POA: Diagnosis not present

## 2020-07-12 DIAGNOSIS — Z1211 Encounter for screening for malignant neoplasm of colon: Secondary | ICD-10-CM | POA: Diagnosis not present

## 2020-07-12 DIAGNOSIS — G4700 Insomnia, unspecified: Secondary | ICD-10-CM | POA: Diagnosis not present

## 2020-07-12 DIAGNOSIS — R7309 Other abnormal glucose: Secondary | ICD-10-CM | POA: Diagnosis not present

## 2020-07-12 DIAGNOSIS — E559 Vitamin D deficiency, unspecified: Secondary | ICD-10-CM | POA: Diagnosis not present

## 2020-07-12 DIAGNOSIS — Z79899 Other long term (current) drug therapy: Secondary | ICD-10-CM | POA: Diagnosis not present

## 2020-07-12 DIAGNOSIS — Z0001 Encounter for general adult medical examination with abnormal findings: Secondary | ICD-10-CM | POA: Diagnosis not present

## 2020-07-12 DIAGNOSIS — E7800 Pure hypercholesterolemia, unspecified: Secondary | ICD-10-CM | POA: Diagnosis not present

## 2020-07-16 DIAGNOSIS — Z1152 Encounter for screening for COVID-19: Secondary | ICD-10-CM | POA: Diagnosis not present

## 2020-07-16 DIAGNOSIS — Z03818 Encounter for observation for suspected exposure to other biological agents ruled out: Secondary | ICD-10-CM | POA: Diagnosis not present

## 2020-07-30 DIAGNOSIS — Z23 Encounter for immunization: Secondary | ICD-10-CM | POA: Diagnosis not present

## 2020-07-31 ENCOUNTER — Ambulatory Visit: Payer: Self-pay

## 2020-08-02 ENCOUNTER — Encounter: Payer: Self-pay | Admitting: Physician Assistant

## 2020-10-14 DIAGNOSIS — R194 Change in bowel habit: Secondary | ICD-10-CM | POA: Diagnosis not present

## 2020-10-24 DIAGNOSIS — Z658 Other specified problems related to psychosocial circumstances: Secondary | ICD-10-CM | POA: Diagnosis not present

## 2020-11-26 DIAGNOSIS — H0102A Squamous blepharitis right eye, upper and lower eyelids: Secondary | ICD-10-CM | POA: Diagnosis not present

## 2020-11-26 DIAGNOSIS — Z961 Presence of intraocular lens: Secondary | ICD-10-CM | POA: Diagnosis not present

## 2020-11-26 DIAGNOSIS — H40013 Open angle with borderline findings, low risk, bilateral: Secondary | ICD-10-CM | POA: Diagnosis not present

## 2020-11-26 DIAGNOSIS — E119 Type 2 diabetes mellitus without complications: Secondary | ICD-10-CM | POA: Diagnosis not present

## 2020-11-26 DIAGNOSIS — H43812 Vitreous degeneration, left eye: Secondary | ICD-10-CM | POA: Diagnosis not present

## 2020-11-26 DIAGNOSIS — H43811 Vitreous degeneration, right eye: Secondary | ICD-10-CM | POA: Diagnosis not present

## 2020-11-26 DIAGNOSIS — H0102B Squamous blepharitis left eye, upper and lower eyelids: Secondary | ICD-10-CM | POA: Diagnosis not present

## 2020-11-26 DIAGNOSIS — H16223 Keratoconjunctivitis sicca, not specified as Sjogren's, bilateral: Secondary | ICD-10-CM | POA: Diagnosis not present

## 2020-12-10 DIAGNOSIS — R197 Diarrhea, unspecified: Secondary | ICD-10-CM | POA: Diagnosis not present

## 2020-12-10 DIAGNOSIS — R194 Change in bowel habit: Secondary | ICD-10-CM | POA: Diagnosis not present

## 2020-12-10 DIAGNOSIS — D125 Benign neoplasm of sigmoid colon: Secondary | ICD-10-CM | POA: Diagnosis not present

## 2020-12-10 DIAGNOSIS — K648 Other hemorrhoids: Secondary | ICD-10-CM | POA: Diagnosis not present

## 2020-12-10 DIAGNOSIS — K644 Residual hemorrhoidal skin tags: Secondary | ICD-10-CM | POA: Diagnosis not present

## 2020-12-10 DIAGNOSIS — K5730 Diverticulosis of large intestine without perforation or abscess without bleeding: Secondary | ICD-10-CM | POA: Diagnosis not present

## 2020-12-10 DIAGNOSIS — K6289 Other specified diseases of anus and rectum: Secondary | ICD-10-CM | POA: Diagnosis not present

## 2020-12-13 DIAGNOSIS — D125 Benign neoplasm of sigmoid colon: Secondary | ICD-10-CM | POA: Diagnosis not present

## 2021-01-08 DIAGNOSIS — H40013 Open angle with borderline findings, low risk, bilateral: Secondary | ICD-10-CM | POA: Diagnosis not present

## 2021-01-08 DIAGNOSIS — H0102A Squamous blepharitis right eye, upper and lower eyelids: Secondary | ICD-10-CM | POA: Diagnosis not present

## 2021-01-08 DIAGNOSIS — H43812 Vitreous degeneration, left eye: Secondary | ICD-10-CM | POA: Diagnosis not present

## 2021-01-08 DIAGNOSIS — H16223 Keratoconjunctivitis sicca, not specified as Sjogren's, bilateral: Secondary | ICD-10-CM | POA: Diagnosis not present

## 2021-01-08 DIAGNOSIS — H43811 Vitreous degeneration, right eye: Secondary | ICD-10-CM | POA: Diagnosis not present

## 2021-01-08 DIAGNOSIS — Z961 Presence of intraocular lens: Secondary | ICD-10-CM | POA: Diagnosis not present

## 2021-01-08 DIAGNOSIS — E119 Type 2 diabetes mellitus without complications: Secondary | ICD-10-CM | POA: Diagnosis not present

## 2021-01-08 DIAGNOSIS — H0102B Squamous blepharitis left eye, upper and lower eyelids: Secondary | ICD-10-CM | POA: Diagnosis not present

## 2021-01-09 DIAGNOSIS — E782 Mixed hyperlipidemia: Secondary | ICD-10-CM | POA: Diagnosis not present

## 2021-01-09 DIAGNOSIS — Z79899 Other long term (current) drug therapy: Secondary | ICD-10-CM | POA: Diagnosis not present

## 2021-01-09 DIAGNOSIS — L509 Urticaria, unspecified: Secondary | ICD-10-CM | POA: Diagnosis not present

## 2021-01-09 DIAGNOSIS — R7309 Other abnormal glucose: Secondary | ICD-10-CM | POA: Diagnosis not present

## 2021-01-09 DIAGNOSIS — L280 Lichen simplex chronicus: Secondary | ICD-10-CM | POA: Diagnosis not present

## 2021-01-09 DIAGNOSIS — R030 Elevated blood-pressure reading, without diagnosis of hypertension: Secondary | ICD-10-CM | POA: Diagnosis not present

## 2021-01-09 DIAGNOSIS — E559 Vitamin D deficiency, unspecified: Secondary | ICD-10-CM | POA: Diagnosis not present

## 2021-01-09 DIAGNOSIS — E7800 Pure hypercholesterolemia, unspecified: Secondary | ICD-10-CM | POA: Diagnosis not present

## 2021-01-09 DIAGNOSIS — G4700 Insomnia, unspecified: Secondary | ICD-10-CM | POA: Diagnosis not present

## 2021-01-09 DIAGNOSIS — M797 Fibromyalgia: Secondary | ICD-10-CM | POA: Diagnosis not present

## 2021-01-09 DIAGNOSIS — N3010 Interstitial cystitis (chronic) without hematuria: Secondary | ICD-10-CM | POA: Diagnosis not present

## 2021-01-13 DIAGNOSIS — L309 Dermatitis, unspecified: Secondary | ICD-10-CM | POA: Diagnosis not present

## 2021-01-29 ENCOUNTER — Other Ambulatory Visit: Payer: Self-pay | Admitting: Obstetrics and Gynecology

## 2021-01-29 DIAGNOSIS — Z1231 Encounter for screening mammogram for malignant neoplasm of breast: Secondary | ICD-10-CM

## 2021-02-13 DIAGNOSIS — L309 Dermatitis, unspecified: Secondary | ICD-10-CM | POA: Diagnosis not present

## 2021-02-19 DIAGNOSIS — Z86010 Personal history of colonic polyps: Secondary | ICD-10-CM | POA: Diagnosis not present

## 2021-02-19 DIAGNOSIS — R194 Change in bowel habit: Secondary | ICD-10-CM | POA: Diagnosis not present

## 2021-03-20 DIAGNOSIS — L309 Dermatitis, unspecified: Secondary | ICD-10-CM | POA: Diagnosis not present

## 2021-04-22 DIAGNOSIS — H0102B Squamous blepharitis left eye, upper and lower eyelids: Secondary | ICD-10-CM | POA: Diagnosis not present

## 2021-04-22 DIAGNOSIS — H40013 Open angle with borderline findings, low risk, bilateral: Secondary | ICD-10-CM | POA: Diagnosis not present

## 2021-04-22 DIAGNOSIS — Z961 Presence of intraocular lens: Secondary | ICD-10-CM | POA: Diagnosis not present

## 2021-04-22 DIAGNOSIS — H16223 Keratoconjunctivitis sicca, not specified as Sjogren's, bilateral: Secondary | ICD-10-CM | POA: Diagnosis not present

## 2021-04-22 DIAGNOSIS — H43812 Vitreous degeneration, left eye: Secondary | ICD-10-CM | POA: Diagnosis not present

## 2021-04-22 DIAGNOSIS — H43811 Vitreous degeneration, right eye: Secondary | ICD-10-CM | POA: Diagnosis not present

## 2021-04-22 DIAGNOSIS — E119 Type 2 diabetes mellitus without complications: Secondary | ICD-10-CM | POA: Diagnosis not present

## 2021-04-22 DIAGNOSIS — H0102A Squamous blepharitis right eye, upper and lower eyelids: Secondary | ICD-10-CM | POA: Diagnosis not present

## 2021-04-30 DIAGNOSIS — Z23 Encounter for immunization: Secondary | ICD-10-CM | POA: Diagnosis not present

## 2021-05-26 DIAGNOSIS — N3010 Interstitial cystitis (chronic) without hematuria: Secondary | ICD-10-CM | POA: Diagnosis not present

## 2021-05-27 NOTE — Progress Notes (Signed)
Office Visit Note  Patient: Rebecca Newton             Date of Birth: [birth year]           MRN: [id number]             PCP: Josetta Huddle, MD Referring: Josetta Huddle, MD Visit Date: 06/10/2021 Occupation: '@GUAROCC' @  Subjective:  Routine follow up   History of Present Illness: Rebecca Newton is a 75 y.o. female with history of fibromyalgia, osteoarthritis, and DDD.  Overall the patient's fibromyalgia has been well controlled with no recent flares.  She has occasional muscle tenderness which has been tolerable.  She remains under tremendous amount of stress acting as a caregiver for her husband.  She takes Ambien 10 mg at bedtime as needed for insomnia.  She has noticed very intermittent symptoms of buckling and instability in her right lower extremity.  She has not had any recent falls.  She is not experiencing any numbness or tingling in the right lower extremity at this time.  She denies any joint swelling.  She plans on following up with Dr. Inda Merlin for further evaluation and management.  Activities of Daily Living:  Patient reports morning stiffness for 0 minutes  Patient Denies nocturnal pain.  Difficulty dressing/grooming: Denies Difficulty climbing stairs: Denies Difficulty getting out of chair: Denies Difficulty using hands for taps, buttons, cutlery, and/or writing: Denies  Review of Systems  Constitutional:  Negative for fatigue.  HENT:  Negative for mouth sores, mouth dryness and nose dryness.   Eyes:  Negative for pain, visual disturbance and dryness.  Respiratory:  Negative for cough, hemoptysis, shortness of breath and difficulty breathing.   Cardiovascular:  Negative for chest pain, palpitations, hypertension and swelling in legs/feet.  Gastrointestinal:  Negative for blood in stool, constipation and diarrhea.  Endocrine: Negative for excessive thirst and increased urination.  Genitourinary:  Negative for difficulty urinating and painful urination.   Musculoskeletal:  Positive for muscle tenderness. Negative for joint pain, joint pain, joint swelling, myalgias, muscle weakness, morning stiffness and myalgias.  Skin:  Negative for color change, pallor, rash, hair loss, nodules/bumps, skin tightness, ulcers and sensitivity to sunlight.  Allergic/Immunologic: Negative for susceptible to infections.  Neurological:  Negative for dizziness, numbness, headaches and weakness.  Hematological:  Negative for bruising/bleeding tendency and swollen glands.  Psychiatric/Behavioral:  Negative for depressed mood and sleep disturbance. The patient is not nervous/anxious.    PMFS History:  Patient Active Problem List   Diagnosis Date Noted   Primary osteoarthritis of both knees 08/10/2017   Fecal smearing 08/19/2016   Rectocele 08/19/2016   Leukoplakic vulvitis 05/03/2012   Abdominal pain 10/14/2011   Chronic fatigue syndrome 10/14/2011   Chronic migraine without aura 10/14/2011   Dysuria 10/14/2011   Increased frequency of urination 10/14/2011   Irritable bowel syndrome 10/14/2011   Pain 10/14/2011   Urinary urgency 10/14/2011   Osteopenia    Interstitial cystitis    Fibromyalgia     Past Medical History:  Diagnosis Date   Fibromyalgia    Interstitial cystitis    Lichen sclerosus    Osteopenia 12/2015   T score -1.2 FRAX 14%/1.6% stable from prior DEXA    Family History  Problem Relation Age of Onset   Lung cancer Mother    Heart disease Mother    Stroke Father    Diabetes Maternal Grandmother    Heart disease Maternal Grandmother    Breast cancer Sister        Age  89   Cancer Son        Lung cancer   Past Surgical History:  Procedure Laterality Date   BILATERAL SALPINGOOPHORECTOMY     BREAST BIOPSY Right    COMBINED HYSTEROSCOPY DIAGNOSTIC / D&C  2004   HYSTEROSCOPY     LAPAROSCOPIC ASSISTED VAGINAL HYSTERECTOMY  2007   Leiomyoma/adenomyosis   ROTATOR CUFF REPAIR  2006   Social History   Social History Narrative   **  Merged History Encounter **       Lives with spouse Caffeine use: Drinks decaf passion tea No soda Right handed   Immunization History  Administered Date(s) Administered   PFIZER(Purple Top)SARS-COV-2 Vaccination 08/31/2019, 09/26/2019   Zoster Recombinat (Shingrix) 06/23/2017, 09/08/2017     Objective: Vital Signs: BP 132/62 (BP Location: Left Arm, Patient Position: Sitting, Cuff Size: Normal)   Pulse 75   Resp 16   Ht 5' 1.5" (1.562 m)   Wt 113 lb (51.3 kg)   BMI 21.01 kg/m    Physical Exam Vitals and nursing note reviewed.  Constitutional:      Appearance: She is well-developed.  HENT:     Head: Normocephalic and atraumatic.  Eyes:     Conjunctiva/sclera: Conjunctivae normal.  Pulmonary:     Effort: Pulmonary effort is normal.  Abdominal:     Palpations: Abdomen is soft.  Musculoskeletal:     Cervical back: Normal range of motion.  Skin:    General: Skin is warm and dry.     Capillary Refill: Capillary refill takes less than 2 seconds.  Neurological:     Mental Status: She is alert and oriented to person, place, and time.  Psychiatric:        Behavior: Behavior normal.     Musculoskeletal Exam: C-spine has slightly limited ROM with lateral rotation.  No midline spinal tenderness or SI joint tenderness.  Shoulder joints, elbow joints, wrist joints, MCPs, PIPs, and DIPs good ROM with no synovitis.  PIP and DIP thickening consistent with osteoarthritis of both hands.  Complete fist formation bilaterally. Hip joints have good ROM with no groin pain.  Tenderness over bilateral trochanteric bursa and IT band.  Knee joints have good ROM with no warmth or effusion.  Ankle joints have good ROM with no tenderness or joint swelling.   CDAI Exam: CDAI Score: -- Patient Global: --; Provider Global: -- Swollen: --; Tender: -- Joint Exam 06/10/2021   No joint exam has been documented for this visit   There is currently no information documented on the homunculus. Go to the  Rheumatology activity and complete the homunculus joint exam.  Investigation: No additional findings.  Imaging: No results found.  Recent Labs: Lab Results  Component Value Date   WBC 6.2 04/24/2010   HGB 14.2 04/24/2010   PLT 215 04/24/2010   NA 141 04/24/2010   K 3.6 04/24/2010   CL 111 04/24/2010   CO2 22 04/24/2010   GLUCOSE 99 04/24/2010   BUN 16 04/24/2010   CREATININE 0.83 04/24/2010   CALCIUM 9.0 04/24/2010   GFRAA  04/24/2010    >60        The eGFR has been calculated using the MDRD equation. This calculation has not been validated in all clinical situations. eGFR's persistently <60 mL/min signify possible Chronic Kidney Disease.    Speciality Comments: No specialty comments available.  Procedures:  No procedures performed Allergies: Macrodantin, Alprazolam, Ciprofibrate, Ciprofloxacin, Lyrica [pregabalin], Macrobid [nitrofurantoin], Topiramate, and Codeine   Assessment / Plan:  Visit Diagnoses: Fibromyalgia: She experiences occasional muscle tenderness but overall her pain secondary to fibromyalgia has been tolerable.  She remains under tremendous amount of stress acting as a caregiver for her husband.  She experiences chronic fatigue secondary to insomnia.  She is been taking Ambien 10 mg at bedtime to help her sleep at night.  Discussed the importance of regular exercise and good sleep hygiene.  She was advised to notify us if she has more frequent or severe flares.  She will follow-up in the office in 1 year.  Primary insomnia - She takes Ambien 10 mg 1 tablet by mouth at bedtime as needed for insomnia.  Other fatigue: Chronic and secondary to insomnia.   Primary osteoarthritis of both knees: X-rays of both knees obtained on 09/03/16 and interpreted by Eliezer Lofts, PA-C: Moderate osteoarthritis and moderate patellofemoral narrowing noted.  Visco gel injections were offered in the past but the patient declined since her symptoms were very mild.  She has  been experiencing intermittent buckling in the right knee but has not noticed any joint swelling.  On examination the right knee joint had good range of motion with no warmth or effusion.  Offered updated x-rays of the right knee at this time but she declined.  She plans on following up with Dr. Inda Merlin for further evaluation and management.  DDD (degenerative disc disease), cervical: C-spine is limited range of motion with lateral rotation.  No symptoms of radiculopathy at this time.  DDD (degenerative disc disease), lumbar: X-rays of the lumbar spine were performed at [place] on 02/11/2018 ordered by Dr. Inda Merlin.  Findings were reviewed today: Mild degenerative disease noted.  She has been experiencing right lower extremity buckling and instability intermittently.  Her symptoms have been infrequent and she has not been experiencing any severe pain.  She had full strength of bilateral lower extremities on examination today.  Negative straight leg raise.  No midline spinal tenderness was noted on examination.  She has some tenderness over bilateral trochanteric bursa.  Offered x-rays of the lumbar spine and pelvis as well as the right knee but she declined at this time.  Offered her referral to physical therapy but she declined at this time.  She would like to have a further work-up and discussed treatment options with Dr. Inda Merlin.  Osteopenia of multiple sites - Future order for DEXA was placed by Dr. Delilah Shan.   Interstitial cystitis: Diagnosed by Dr. Amalia Hailey in the past.   Other medical conditions are listed as follows:   History of IBS  History of migraine  Orders: No orders of the defined types were placed in this encounter.  No orders of the defined types were placed in this encounter.    Follow-Up Instructions: Return in about 1 year (around 06/10/2022) for Fibromyalgia, Osteoarthritis, DDD.   Ofilia Neas, PA-C  Note - This record has been created using Dragon software.  Chart  creation errors have been sought, but may not always  have been located. Such creation errors do not reflect on  the standard of medical care.

## 2021-06-10 ENCOUNTER — Ambulatory Visit: Payer: Self-pay

## 2021-06-10 ENCOUNTER — Other Ambulatory Visit: Payer: Self-pay

## 2021-06-10 ENCOUNTER — Encounter: Payer: Self-pay | Admitting: Physician Assistant

## 2021-06-10 ENCOUNTER — Ambulatory Visit (INDEPENDENT_AMBULATORY_CARE_PROVIDER_SITE_OTHER): Payer: Medicare Other | Admitting: Physician Assistant

## 2021-06-10 VITALS — BP 132/62 | HR 75 | Resp 16 | Ht 61.5 in | Wt 113.0 lb

## 2021-06-10 DIAGNOSIS — M797 Fibromyalgia: Secondary | ICD-10-CM | POA: Diagnosis not present

## 2021-06-10 DIAGNOSIS — R5383 Other fatigue: Secondary | ICD-10-CM | POA: Diagnosis not present

## 2021-06-10 DIAGNOSIS — M170 Bilateral primary osteoarthritis of knee: Secondary | ICD-10-CM

## 2021-06-10 DIAGNOSIS — M79672 Pain in left foot: Secondary | ICD-10-CM

## 2021-06-10 DIAGNOSIS — N3010 Interstitial cystitis (chronic) without hematuria: Secondary | ICD-10-CM | POA: Diagnosis not present

## 2021-06-10 DIAGNOSIS — M5030 Other cervical disc degeneration, unspecified cervical region: Secondary | ICD-10-CM

## 2021-06-10 DIAGNOSIS — F5101 Primary insomnia: Secondary | ICD-10-CM

## 2021-06-10 DIAGNOSIS — Z8669 Personal history of other diseases of the nervous system and sense organs: Secondary | ICD-10-CM | POA: Diagnosis not present

## 2021-06-10 DIAGNOSIS — M8589 Other specified disorders of bone density and structure, multiple sites: Secondary | ICD-10-CM | POA: Diagnosis not present

## 2021-06-10 DIAGNOSIS — M7711 Lateral epicondylitis, right elbow: Secondary | ICD-10-CM

## 2021-06-10 DIAGNOSIS — Z8719 Personal history of other diseases of the digestive system: Secondary | ICD-10-CM

## 2021-06-10 DIAGNOSIS — M5136 Other intervertebral disc degeneration, lumbar region: Secondary | ICD-10-CM | POA: Diagnosis not present

## 2021-06-11 ENCOUNTER — Ambulatory Visit
Admission: RE | Admit: 2021-06-11 | Discharge: 2021-06-11 | Disposition: A | Discharge status: Home / Self Care | Payer: Medicare Other | Source: Ambulatory Visit | Attending: Obstetrics and Gynecology | Admitting: Obstetrics and Gynecology

## 2021-06-11 DIAGNOSIS — Z1231 Encounter for screening mammogram for malignant neoplasm of breast: Secondary | ICD-10-CM | POA: Diagnosis not present

## 2021-06-16 ENCOUNTER — Ambulatory Visit: Payer: Medicare Other | Admitting: Obstetrics and Gynecology

## 2021-06-24 ENCOUNTER — Ambulatory Visit: Payer: Medicare Other | Admitting: Obstetrics and Gynecology

## 2021-06-26 ENCOUNTER — Ambulatory Visit
Admission: RE | Admit: 2021-06-26 | Discharge: 2021-06-26 | Disposition: A | Discharge status: Home / Self Care | Payer: Medicare Other | Source: Ambulatory Visit | Attending: Internal Medicine | Admitting: Internal Medicine

## 2021-06-26 ENCOUNTER — Other Ambulatory Visit: Payer: Self-pay | Admitting: Internal Medicine

## 2021-06-26 ENCOUNTER — Ambulatory Visit: Payer: Medicare Other | Admitting: Nurse Practitioner

## 2021-06-26 DIAGNOSIS — M25551 Pain in right hip: Secondary | ICD-10-CM | POA: Diagnosis not present

## 2021-06-26 DIAGNOSIS — M7918 Myalgia, other site: Secondary | ICD-10-CM | POA: Diagnosis not present

## 2021-06-26 DIAGNOSIS — R1031 Right lower quadrant pain: Secondary | ICD-10-CM | POA: Diagnosis not present

## 2021-06-30 ENCOUNTER — Encounter: Payer: Self-pay | Admitting: Nurse Practitioner

## 2021-06-30 ENCOUNTER — Other Ambulatory Visit: Payer: Self-pay

## 2021-06-30 ENCOUNTER — Ambulatory Visit (INDEPENDENT_AMBULATORY_CARE_PROVIDER_SITE_OTHER): Payer: Medicare Other | Admitting: Nurse Practitioner

## 2021-06-30 VITALS — BP 122/73

## 2021-06-30 DIAGNOSIS — N763 Subacute and chronic vulvitis: Secondary | ICD-10-CM | POA: Diagnosis not present

## 2021-06-30 MED ORDER — CLOBETASOL PROPIONATE 0.05 % EX OINT: 1.0000 "application " | TOPICAL_OINTMENT | CUTANEOUS | 0 refills | Status: DC

## 2021-06-30 NOTE — Progress Notes (Signed)
   Acute Office Visit  Subjective:    Patient ID: Rebecca Newton, female    DOB: 10-Jul-1946, 75 y.o.   MRN: [id number]   HPI 75 y.o. presents today for vulvar irritation with wiping. The irritation is only at the right of the clitoral hood. Denies vaginal discharge or odor. History of lichen sclerosus. Used estrogen cream in the past but stopped due to cost.    Review of Systems  Constitutional: Negative.   Genitourinary:  Positive for vaginal pain (Irritation). Negative for vaginal bleeding and vaginal discharge.      Objective:    Physical Exam Constitutional:      Appearance: Normal appearance.  Genitourinary:    General: Normal vulva.       Comments: Atrophic changes present   BP 122/73 (BP Location: Right Arm, Patient Position: Sitting, Cuff Size: Normal)  Wt Readings from Last 3 Encounters:  06/10/21 113 lb (51.3 kg)  06/05/20 112 lb (50.8 kg)  04/17/20 115 lb (52.2 kg)        Assessment & Plan:   Problem List Items Addressed This Visit   None Visit Diagnoses     Subacute vulvitis    -  Primary   Relevant Medications   clobetasol ointment (TEMOVATE) 0.05 %      Plan: Normal exam with no evidence of lichen sclerosus changes. Recommend applying Clobetasol ointment to area of discomfort. She will return if no improvement after 2 weeks of consistent use. Could also be atrophic vaginitis. Gentle washing and pat dry to avoid worsening irritation.      Tamela Gammon DNP, 5:05 PM 06/30/2021

## 2021-07-07 ENCOUNTER — Ambulatory Visit: Payer: Medicare Other | Admitting: Obstetrics and Gynecology

## 2021-08-12 DIAGNOSIS — R7303 Prediabetes: Secondary | ICD-10-CM | POA: Diagnosis not present

## 2021-08-12 DIAGNOSIS — M797 Fibromyalgia: Secondary | ICD-10-CM | POA: Diagnosis not present

## 2021-08-12 DIAGNOSIS — E559 Vitamin D deficiency, unspecified: Secondary | ICD-10-CM | POA: Diagnosis not present

## 2021-08-12 DIAGNOSIS — R7309 Other abnormal glucose: Secondary | ICD-10-CM | POA: Diagnosis not present

## 2021-08-12 DIAGNOSIS — Z0001 Encounter for general adult medical examination with abnormal findings: Secondary | ICD-10-CM | POA: Diagnosis not present

## 2021-08-12 DIAGNOSIS — E782 Mixed hyperlipidemia: Secondary | ICD-10-CM | POA: Diagnosis not present

## 2021-08-12 DIAGNOSIS — N3010 Interstitial cystitis (chronic) without hematuria: Secondary | ICD-10-CM | POA: Diagnosis not present

## 2021-08-12 DIAGNOSIS — L280 Lichen simplex chronicus: Secondary | ICD-10-CM | POA: Diagnosis not present

## 2021-08-12 DIAGNOSIS — R030 Elevated blood-pressure reading, without diagnosis of hypertension: Secondary | ICD-10-CM | POA: Diagnosis not present

## 2021-08-12 DIAGNOSIS — L509 Urticaria, unspecified: Secondary | ICD-10-CM | POA: Diagnosis not present

## 2021-08-12 DIAGNOSIS — Z79899 Other long term (current) drug therapy: Secondary | ICD-10-CM | POA: Diagnosis not present

## 2021-08-12 DIAGNOSIS — G4700 Insomnia, unspecified: Secondary | ICD-10-CM | POA: Diagnosis not present

## 2021-09-23 ENCOUNTER — Other Ambulatory Visit: Payer: Self-pay | Admitting: Nurse Practitioner

## 2021-09-23 DIAGNOSIS — Z1231 Encounter for screening mammogram for malignant neoplasm of breast: Secondary | ICD-10-CM

## 2021-12-26 DIAGNOSIS — L309 Dermatitis, unspecified: Secondary | ICD-10-CM | POA: Diagnosis not present

## 2022-02-03 DIAGNOSIS — L309 Dermatitis, unspecified: Secondary | ICD-10-CM | POA: Diagnosis not present

## 2022-03-19 ENCOUNTER — Ambulatory Visit (INDEPENDENT_AMBULATORY_CARE_PROVIDER_SITE_OTHER): Payer: Medicare Other | Admitting: Nurse Practitioner

## 2022-03-19 ENCOUNTER — Encounter: Payer: Self-pay | Admitting: Nurse Practitioner

## 2022-03-19 ENCOUNTER — Telehealth: Payer: Self-pay

## 2022-03-19 VITALS — BP 110/62 | HR 48

## 2022-03-19 DIAGNOSIS — R159 Full incontinence of feces: Secondary | ICD-10-CM | POA: Diagnosis not present

## 2022-03-19 DIAGNOSIS — N763 Subacute and chronic vulvitis: Secondary | ICD-10-CM

## 2022-03-19 DIAGNOSIS — N816 Rectocele: Secondary | ICD-10-CM | POA: Diagnosis not present

## 2022-03-19 DIAGNOSIS — R150 Incomplete defecation: Secondary | ICD-10-CM

## 2022-03-19 MED ORDER — CLOBETASOL PROPIONATE 0.05 % EX OINT: 1.0000 | TOPICAL_OINTMENT | CUTANEOUS | 0 refills | Status: DC

## 2022-03-19 NOTE — Telephone Encounter (Signed)
Order placed

## 2022-03-19 NOTE — Telephone Encounter (Signed)
Pelvic floor PT Received: Today Rebecca Gammon, NP  P [facility] Triage Please send referral to pelvic floor PT for rectocele, fecal incontinence, and incomplete defecation.

## 2022-03-19 NOTE — Progress Notes (Signed)
   Acute Office Visit  Subjective:    Patient ID: Rebecca Newton, female    DOB: 02-22-1946, 76 y.o.   MRN: [id number]   HPI 76 y.o. G1P1000 presents today for discussion on recent recommendations. She was seen 03/12/2022 by Dr. Maryland Pink with Urogynecology for stage 2 rectocele and fecal incontinence follow up. Saw 5 years prior per patient. She reports that surgery was recommended to repair rectocele. She has loose stools and accidents. She finds smears in her underwear. Does not think she fully empties her bowels. When stools are more formed they are pencil thin. Has been using Miralax and Citrucel. Colonoscopy in April 2023 showed 2 polyps. Was told to decrease Miralax when stools are mushy and discussed repairing rectocele to limit stool trapping and improve evacuation. Report 200 stitches after vaginal delivery. She is unsure of her decision because she cares for her husband. She does lots of lifting for him. Wondering if there is another option. Just started vaginal estrogen twice weekly. Needs refill on Clobetasol.    Review of Systems  Constitutional: Negative.   Gastrointestinal:  Positive for diarrhea. Negative for constipation, nausea, rectal pain and vomiting.       Incomplete defecation, fecal incontinence  Genitourinary: Negative.        Objective:    Physical Exam Constitutional:      Appearance: Normal appearance.  Genitourinary:    General: Normal vulva.     Uterus: Absent.      Rectum: Anal tone abnormal: Slightly weakened.     Comments: Pelvic floor weakness, 2+ rectocele    BP 110/62   Pulse (!) 48   SpO2 98%  Wt Readings from Last 3 Encounters:  06/10/21 113 lb (51.3 kg)  06/05/20 112 lb (50.8 kg)  04/17/20 115 lb (52.2 kg)        Patient informed chaperone available to be present for breast and/or pelvic exam. Patient has requested no chaperone to be present. Patient has been advised what will be completed during breast and pelvic exam.    Assessment & Plan:   Problem List Items Addressed This Visit       Digestive   Rectocele - Primary   Other Visit Diagnoses     Incontinence of feces, unspecified fecal incontinence type       Subacute vulvitis       Relevant Medications   clobetasol ointment (TEMOVATE) 0.05 %   Incomplete defecation          Plan: We discussed rectocele repair and recovery time as well as different surgery for sphincter repair if this is also needed. She does not want surgery at this time. Recommend vaginal splinting with bowel movements, continue Miralax and fiber supplement, pelvic floor PT, continue using squatty potty. If no improvement with recommendations she will consider surgery in the future.     Tamela Gammon DNP, 2:26 PM 03/19/2022

## 2022-03-24 NOTE — Telephone Encounter (Signed)
Patient is scheduled 04/10/22

## 2022-04-10 ENCOUNTER — Other Ambulatory Visit: Payer: Self-pay

## 2022-04-10 ENCOUNTER — Ambulatory Visit: Payer: Medicare Other | Attending: Nurse Practitioner | Admitting: Physical Therapy

## 2022-04-10 DIAGNOSIS — R293 Abnormal posture: Secondary | ICD-10-CM | POA: Diagnosis not present

## 2022-04-10 DIAGNOSIS — M6281 Muscle weakness (generalized): Secondary | ICD-10-CM | POA: Diagnosis not present

## 2022-04-10 DIAGNOSIS — R159 Full incontinence of feces: Secondary | ICD-10-CM | POA: Insufficient documentation

## 2022-04-10 DIAGNOSIS — R279 Unspecified lack of coordination: Secondary | ICD-10-CM | POA: Insufficient documentation

## 2022-04-10 DIAGNOSIS — N816 Rectocele: Secondary | ICD-10-CM | POA: Diagnosis not present

## 2022-04-10 DIAGNOSIS — R150 Incomplete defecation: Secondary | ICD-10-CM | POA: Diagnosis not present

## 2022-04-10 NOTE — Therapy (Signed)
OUTPATIENT PHYSICAL THERAPY FEMALE PELVIC EVALUATION   Patient Name: Rebecca Newton MRN: [id number] DOB:Oct 13, 1945, 76 y.o., female Today's Date: 04/10/2022   PT End of Session - 04/10/22 1113     Visit Number 1    Date for PT Re-Evaluation 07/10/22    Authorization Type Medicare    Progress Note Due on Visit 10    Activity Tolerance Patient tolerated treatment well    Behavior During Therapy [facility] for tasks assessed/performed             Past Medical History:  Diagnosis Date   Fibromyalgia    Interstitial cystitis    Lichen sclerosus    Osteopenia 12/2015   T score -1.2 FRAX 14%/1.6% stable from prior DEXA   Past Surgical History:  Procedure Laterality Date   BILATERAL SALPINGOOPHORECTOMY     BREAST BIOPSY Right    COMBINED HYSTEROSCOPY DIAGNOSTIC / D&C  2004   HYSTEROSCOPY     LAPAROSCOPIC ASSISTED VAGINAL HYSTERECTOMY  2007   Leiomyoma/adenomyosis   ROTATOR CUFF REPAIR  2006   Patient Active Problem List   Diagnosis Date Noted   Primary osteoarthritis of both knees 08/10/2017   Fecal smearing 08/19/2016   Rectocele 08/19/2016   Leukoplakic vulvitis 05/03/2012   Abdominal pain 10/14/2011   Chronic fatigue syndrome 10/14/2011   Chronic migraine without aura 10/14/2011   Dysuria 10/14/2011   Increased frequency of urination 10/14/2011   Irritable bowel syndrome 10/14/2011   Pain 10/14/2011   Urinary urgency 10/14/2011   Osteopenia    Interstitial cystitis    Fibromyalgia     PCP: Josetta Huddle, MD  REFERRING PROVIDER: Tamela Gammon, NP  REFERRING DIAG: R15.9,R15.0 (ICD-10-CM) - Fecal incontinence with incomplete defecation N81.6 (ICD-10-CM) - Rectocele  THERAPY DIAG:  Muscle weakness (generalized)  Unspecified lack of coordination  Abnormal posture  Rationale for Evaluation and Treatment Rehabilitation  ONSET DATE: several months  SUBJECTIVE:                                                                                                                                                                                            SUBJECTIVE STATEMENT: 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 Pt reports she has began having fecal incontinence and had rectocele. Pt reports her doctor recommended surgery for repair but she is unable to take time for recovery post surgery due to husband's care needs.  Pt has soft/liquid stool leakage with out pattern, takes miralax and citrucel. Pt reports leakage has gotten much better since seeing Dr. Zigmund Daniel but wants to strengthen as much as she can to  avoid or delay surgery.     PAIN:  Are you having pain? No  PRECAUTIONS: None  WEIGHT BEARING RESTRICTIONS No  FALLS:  Has patient fallen in last 6 months? No  LIVING ENVIRONMENT: Lives with: lives with their spouse Lives in: House/apartment   OCCUPATION: retired   PLOF: Independent  PATIENT GOALS to have less leakage and be stronger  PERTINENT HISTORY:  stage 2 rectocele and fecal incontinence, Subacute vulvitis,  IC, 1 FAVD, episiotomy.   Sexual abuse: No  BOWEL MOVEMENT Pain with bowel movement: No Type of bowel movement:Type (Bristol Stool Scale) 2-6, Frequency varies, and Strain No - was straining and stool was very loose prior to medication but this has improved Fully empty rectum: Yes: now with medication  Leakage: Yes: before medication was having leakage randomly but now has only had one instance of loose smearing in  a month  Pads: Yes: has a cotton ball at rectum to prevent leakage Fiber supplement: Yes: Miralax and Citrucel   URINATION Pain with urination: No Fully empty bladder: Yes:   Stream: Strong and Weak Urgency: No Frequency: 2 hours Leakage:  not reported  Pads: No  INTERCOURSE Pain with intercourse:  not active   PREGNANCY Vaginal deliveries 1 Tearing Yes: reports "200 stitches"  C-section deliveries 0 Currently pregnant No  PROLAPSE Rectocele  stage 2 per chart    OBJECTIVE:   DIAGNOSTIC FINDINGS:     COGNITION:  Overall cognitive status: Within functional limits for tasks assessed     SENSATION:  Light touch: Appears intact  Proprioception: Appears intact  MUSCLE LENGTH: Bil hamstrings and adductors limited by 25%               POSTURE: rounded shoulders, forward head, and posterior pelvic tilt   LUMBARAROM/PROM  A/PROM A/PROM  eval  Flexion Limited by 50%  Extension Limited by 25%  Right lateral flexion Limited by 50%  Left lateral flexion Limited by 50%  Right rotation Limited by 50%  Left rotation Limited by 50%   (Blank rows = not tested)  LOWER EXTREMITY ROM:  WFL  LOWER EXTREMITY MMT:  Bil hips grossly 4/5, knees and ankles 5/5   PALPATION:   General  no TTP                 External Perineal Exam deferred due to time                             Internal Pelvic Floor deferred due to time  Patient confirms identification and approves PT to assess internal pelvic floor and treatment No *Will complete at next visit PELVIC MMT:   MMT eval  Vaginal   Internal Anal Sphincter   External Anal Sphincter   Puborectalis   Diastasis Recti   (Blank rows = not tested)        TONE: Deferred due to time  PROLAPSE: Deferred due to time  TODAY'S TREATMENT  EVAL Examination completed, findings reviewed, pt educated on POC, HEP. Pt required extra time to answer all questions and address all concerns regarding pelvic floor therapy and plan for evaluation and treatment. Pt reports she was stressed about this appointment not knowing what to expect and benefited from extra time to discuss this for improved understanding of POC and examination. Pt motivated to participate in PT and agreeable to attempt recommendations.     PATIENT EDUCATION:  Education details: [id number] Person educated: Patient Education method:  Explanation, Demonstration, Tactile cues, Verbal cues, and Handouts Education  comprehension: verbalized understanding and returned demonstration   HOME EXERCISE PROGRAM: [id number]  ASSESSMENT:  CLINICAL IMPRESSION: Patient is a 76 y.o. female  who was seen today for physical therapy evaluation and treatment for rectocele and decal incontinence. 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 Pt reports she was recommended to have rectocele repair surgically but unable to do this due to recovery time and pt has no one to help with husband. Pt rquired extra time during evaluation to discuss history, answer all of pt's concerns and questions about PFPT and POC reporting she was stressed about this appointment not knowing what to expect. All answered, internal exam to be completed next visit due to time and pt comfort. Pt found to have decreased core and hip strength, impaired posture, impaired breathing mechanics, and decreased flexibility in spine and hips. Pt would benefit from additional PT to further address deficits.      OBJECTIVE IMPAIRMENTS decreased coordination, decreased endurance, decreased strength, increased fascial restrictions, impaired flexibility, improper body mechanics, and postural dysfunction.   ACTIVITY LIMITATIONS continence and caring for others  PARTICIPATION LIMITATIONS: cleaning, community activity, yard work, and caregiver to spouse   PERSONAL FACTORS Age, Fitness, Time since onset of injury/illness/exacerbation, and 1 comorbidity: medical history, physical demands for caregiving   are also affecting patient's functional outcome.   REHAB POTENTIAL: Good  CLINICAL DECISION MAKING: Stable/uncomplicated  EVALUATION COMPLEXITY: Low   GOALS: Goals reviewed with patient? Yes  SHORT TERM GOALS: Target date: 05/08/2022  Pt to be I with HEP.  Baseline: Goal status: INITIAL  2.  Pt will report her BMs are complete and on average type 4 due to improved bowel  habits and evacuation techniques.  Baseline:  Goal status: INITIAL  3.  Pt to demonstrate at least 3/5 pelvic floor strength for improved pelvic stability and decreased strain at pelvic floor/ decrease leakage.  Baseline:  Goal status: INITIAL  4.  Pt to be I with breathing mechanics and lifting mechanics with body weight squats without compensatory strategies to decreased strain at pelvic floor and rectocele.  Baseline:  Goal status: INITIAL    LONG TERM GOALS: Target date:  07/10/22    Pt to be I with advanced HEP.  Baseline:  Goal status: INITIAL  2.  Pt to demonstrate at least 5/5 bil hip strength for improved pelvic stability and functional squats without leakage.  Baseline:  Goal status: INITIAL  3.  Pt to be I with breathing mechanics and lifting mechanics with 15# squats without compensatory strategies to decreased strain at pelvic floor and rectocele.  Baseline:  Goal status: INITIAL  4.   Pt to demonstrate at least 4/5 pelvic floor strength for improved pelvic stability and decreased strain at pelvic floor/ decrease leakage.  Baseline:  Goal status: INITIAL  5.  Pt to report no more than one instance of fecal leakage in on month to improve QOL. Baseline:  Goal status: INITIAL   PLAN: PT FREQUENCY: 1x/week  PT DURATION:  8 sessions  PLANNED INTERVENTIONS: Therapeutic exercises, Therapeutic activity, Neuromuscular re-education, Patient/Family education, Self Care, Joint mobilization, Dry Needling, Spinal mobilization, Cryotherapy, Moist heat, scar mobilization, Taping, Biofeedback, and Manual therapy  PLAN FOR NEXT SESSION: internal exam, breathing mechanics, pelvic floor coordination with functional tasks, hip and core strengthening, hip/spine stretching.  Stacy Gardner, PT, DPT 04/10/2310:45 AM

## 2022-04-20 ENCOUNTER — Ambulatory Visit: Payer: Medicare Other | Admitting: Physical Therapy

## 2022-04-27 ENCOUNTER — Ambulatory Visit: Payer: Medicare Other | Admitting: Physical Therapy

## 2022-05-04 ENCOUNTER — Telehealth: Payer: Self-pay

## 2022-05-04 NOTE — Telephone Encounter (Signed)
Patient called in voice mail because she wanted you to know she went to the initial Physical Therapy appt but has not been able to go back yet. Her husband took a fall and she has been with him at the hospital and he is in rehab now. When things settle down she will resume PT visits.

## 2022-05-05 DIAGNOSIS — I8311 Varicose veins of right lower extremity with inflammation: Secondary | ICD-10-CM | POA: Diagnosis not present

## 2022-05-05 DIAGNOSIS — I8312 Varicose veins of left lower extremity with inflammation: Secondary | ICD-10-CM | POA: Diagnosis not present

## 2022-05-05 DIAGNOSIS — I872 Venous insufficiency (chronic) (peripheral): Secondary | ICD-10-CM | POA: Diagnosis not present

## 2022-05-12 ENCOUNTER — Ambulatory Visit: Payer: Medicare Other | Attending: Nurse Practitioner | Admitting: Physical Therapy

## 2022-05-12 DIAGNOSIS — R279 Unspecified lack of coordination: Secondary | ICD-10-CM | POA: Insufficient documentation

## 2022-05-12 DIAGNOSIS — M6281 Muscle weakness (generalized): Secondary | ICD-10-CM | POA: Diagnosis not present

## 2022-05-12 DIAGNOSIS — R293 Abnormal posture: Secondary | ICD-10-CM | POA: Insufficient documentation

## 2022-05-12 NOTE — Therapy (Signed)
OUTPATIENT PHYSICAL THERAPY FEMALE PELVIC TREATMENT   Patient Name: Rebecca Newton MRN: [id number] DOB:October 16, 1945, 76 y.o., female Today's Date: 05/12/2022   PT End of Session - 05/12/22 0759     Visit Number 2    Date for PT Re-Evaluation 07/10/22    Authorization Type Medicare    Progress Note Due on Visit 10    PT Start Time 0800    PT Stop Time 0845    PT Time Calculation (min) 45 min    Activity Tolerance Patient tolerated treatment well    Behavior During Therapy [facility] for tasks assessed/performed             Past Medical History:  Diagnosis Date   Fibromyalgia    Interstitial cystitis    Lichen sclerosus    Osteopenia 12/2015   T score -1.2 FRAX 14%/1.6% stable from prior DEXA   Past Surgical History:  Procedure Laterality Date   BILATERAL SALPINGOOPHORECTOMY     BREAST BIOPSY Right    COMBINED HYSTEROSCOPY DIAGNOSTIC / D&C  2004   HYSTEROSCOPY     LAPAROSCOPIC ASSISTED VAGINAL HYSTERECTOMY  2007   Leiomyoma/adenomyosis   ROTATOR CUFF REPAIR  2006   Patient Active Problem List   Diagnosis Date Noted   Primary osteoarthritis of both knees 08/10/2017   Fecal smearing 08/19/2016   Rectocele 08/19/2016   Leukoplakic vulvitis 05/03/2012   Abdominal pain 10/14/2011   Chronic fatigue syndrome 10/14/2011   Chronic migraine without aura 10/14/2011   Dysuria 10/14/2011   Increased frequency of urination 10/14/2011   Irritable bowel syndrome 10/14/2011   Pain 10/14/2011   Urinary urgency 10/14/2011   Osteopenia    Interstitial cystitis    Fibromyalgia     PCP: Josetta Huddle, MD  REFERRING PROVIDER: Tamela Gammon, NP  REFERRING DIAG: R15.9,R15.0 (ICD-10-CM) - Fecal incontinence with incomplete defecation N81.6 (ICD-10-CM) - Rectocele  THERAPY DIAG:  Muscle weakness (generalized)  Unspecified lack of coordination  Rationale for Evaluation and Treatment Rehabilitation  ONSET DATE: several months  SUBJECTIVE:                                                                                                                                                                                            SUBJECTIVE STATEMENT: Pt reports she feels her bowel movements are more shaped/firm and more consistent. Denies need to strain, voiding mechanics have helped a lot. Had a small amount of stool leakage maybe 2-3x since last visit.    PAIN:  Are you having pain? No  PRECAUTIONS: None  WEIGHT BEARING RESTRICTIONS No  FALLS:  Has patient fallen in last 6 months? No  LIVING ENVIRONMENT: Lives with: lives with their spouse Lives in: House/apartment   OCCUPATION: retired   PLOF: Independent  PATIENT GOALS to have less leakage and be stronger  PERTINENT HISTORY:  stage 2 rectocele and fecal incontinence, Subacute vulvitis,  IC, 1 FAVD, episiotomy.   Sexual abuse: No  BOWEL MOVEMENT Pain with bowel movement: No Type of bowel movement:Type (Bristol Stool Scale) 2-6, Frequency varies, and Strain No - was straining and stool was very loose prior to medication but this has improved Fully empty rectum: Yes: now with medication  Leakage: Yes: before medication was having leakage randomly but now has only had one instance of loose smearing in  a month  Pads: Yes: has a cotton ball at rectum to prevent leakage Fiber supplement: Yes: Miralax and Citrucel   URINATION Pain with urination: No Fully empty bladder: Yes:   Stream: Strong and Weak Urgency: No Frequency: 2 hours Leakage:  not reported  Pads: No  INTERCOURSE Pain with intercourse:  not active   PREGNANCY Vaginal deliveries 1 Tearing Yes: reports "200 stitches"  C-section deliveries 0 Currently pregnant No  PROLAPSE Rectocele stage 2 per chart    OBJECTIVE:   DIAGNOSTIC FINDINGS:     COGNITION:  Overall cognitive status: Within functional limits for tasks assessed     SENSATION:  Light touch: Appears intact  Proprioception: Appears intact  MUSCLE  LENGTH: Bil hamstrings and adductors limited by 25%               POSTURE: rounded shoulders, forward head, and posterior pelvic tilt   LUMBARAROM/PROM  A/PROM A/PROM  eval  Flexion Limited by 50%  Extension Limited by 25%  Right lateral flexion Limited by 50%  Left lateral flexion Limited by 50%  Right rotation Limited by 50%  Left rotation Limited by 50%   (Blank rows = not tested)  LOWER EXTREMITY ROM:  WFL  LOWER EXTREMITY MMT:  Bil hips grossly 4/5, knees and ankles 5/5   PALPATION:   General  no TTP                 External Perineal Exam deferred due to time                             Internal Pelvic Floor deferred due to time  Patient confirms identification and approves PT to assess internal pelvic floor and treatment No 05/12/2022 completed with pt consent.   No emotional/communication barriers or cognitive limitation. Patient is motivated to learn. Patient understands and agrees with treatment goals and plan. PT explains patient will be examined in standing, sitting, and lying down to see how their muscles and joints work. When they are ready, they will be asked to remove their underwear so PT can examine their perineum. The patient is also given the option of providing their own chaperone as one is not provided in our facility. The patient also has the right and is explained the right to defer or refuse any part of the evaluation or treatment including the internal exam. With the patient's consent, PT will use one gloved finger to gently assess the muscles of the pelvic floor, seeing how well it contracts and relaxes and if there is muscle symmetry. After, the patient will get dressed and PT and patient will discuss exam findings and plan of care. PT and patient discuss plan of care, schedule, attendance policy and HEP activities.  PELVIC MMT:   MMT  eval  Vaginal 3/5, 8s, 4 reps  Internal Anal Sphincter 3/5, 20s, 3 reps  External Anal Sphincter 3/5, 20s, 3 reps   Puborectalis 3/5, 20s, 3 reps  Diastasis Recti   (Blank rows = not tested)        TONE: Deferred due to time 05/12/2022: slightly decreased    PROLAPSE: Deferred due to time 05/12/2022:  grade 2 posterior wall laxity noted with strong cough in hooklying  TODAY'S TREATMENT  05/12/2022: Pt consented to internal vaginal and rectal assessment today. Findings above 2x10 pelvic floor contractions, quick flicks, W40 9-7D isometrics vaginally X10 pelvic floor contractions, quick flicks, Z32 [id number] isometrics rectally Pt educated on findings, HEP updates, POC, and lifting mechanics.    PATIENT EDUCATION:  Education details: [id number] Person educated: Patient Education method: Education officer, environmental, Corporate treasurer cues, Verbal cues, and Handouts Education comprehension: verbalized understanding and returned demonstration   HOME EXERCISE PROGRAM: [id number]  ASSESSMENT:  CLINICAL IMPRESSION: Patient reports she has been very limited in HEP completion due to husband being in and out of hospital. Pt very motivated to participate. Pt found to have decreased strength, endurance, coordination at vaginal and rectal pelvic floor assessment. Pt given HEP updates and lifting mechanics for decreased strain at pelvic floor. Pt would benefit from additional PT to further address deficits.      OBJECTIVE IMPAIRMENTS decreased coordination, decreased endurance, decreased strength, increased fascial restrictions, impaired flexibility, improper body mechanics, and postural dysfunction.   ACTIVITY LIMITATIONS continence and caring for others  PARTICIPATION LIMITATIONS: cleaning, community activity, yard work, and caregiver to spouse   PERSONAL FACTORS Age, Fitness, Time since onset of injury/illness/exacerbation, and 1 comorbidity: medical history, physical demands for caregiving   are also affecting patient's functional outcome.   REHAB POTENTIAL: Good  CLINICAL DECISION MAKING:  Stable/uncomplicated  EVALUATION COMPLEXITY: Low   GOALS: Goals reviewed with patient? Yes  SHORT TERM GOALS: Target date: 05/08/2022  Pt to be I with HEP.  Baseline: Goal status: INITIAL  2.  Pt will report her BMs are complete and on average type 4 due to improved bowel habits and evacuation techniques.  Baseline:  Goal status: INITIAL  3.  Pt to demonstrate at least 3/5 pelvic floor strength for improved pelvic stability and decreased strain at pelvic floor/ decrease leakage.  Baseline:  Goal status: INITIAL  4.  Pt to be I with breathing mechanics and lifting mechanics with body weight squats without compensatory strategies to decreased strain at pelvic floor and rectocele.  Baseline:  Goal status: INITIAL    LONG TERM GOALS: Target date:  07/10/22    Pt to be I with advanced HEP.  Baseline:  Goal status: INITIAL  2.  Pt to demonstrate at least 5/5 bil hip strength for improved pelvic stability and functional squats without leakage.  Baseline:  Goal status: INITIAL  3.  Pt to be I with breathing mechanics and lifting mechanics with 15# squats without compensatory strategies to decreased strain at pelvic floor and rectocele.  Baseline:  Goal status: INITIAL  4.   Pt to demonstrate at least 4/5 pelvic floor strength for improved pelvic stability and decreased strain at pelvic floor/ decrease leakage.  Baseline:  Goal status: INITIAL  5.  Pt to report no more than one instance of fecal leakage in on month to improve QOL. Baseline:  Goal status: INITIAL   PLAN: PT FREQUENCY: 1x/week  PT DURATION:  8 sessions  PLANNED INTERVENTIONS: Therapeutic exercises, Therapeutic activity, Neuromuscular re-education, Patient/Family education, Self Care, Joint  mobilization, Dry Needling, Spinal mobilization, Cryotherapy, Moist heat, scar mobilization, Taping, Biofeedback, and Manual therapy  PLAN FOR NEXT SESSION: internal exam, breathing mechanics, pelvic floor  coordination with functional tasks, hip and core strengthening, hip/spine stretching.  Stacy Gardner, PT, DPT 05/12/2309:17 AM

## 2022-05-13 ENCOUNTER — Ambulatory Visit: Payer: Medicare Other | Admitting: Physical Therapy

## 2022-05-18 ENCOUNTER — Ambulatory Visit: Payer: Medicare Other | Admitting: Physical Therapy

## 2022-05-18 DIAGNOSIS — M6281 Muscle weakness (generalized): Secondary | ICD-10-CM

## 2022-05-18 DIAGNOSIS — R279 Unspecified lack of coordination: Secondary | ICD-10-CM

## 2022-05-18 DIAGNOSIS — R293 Abnormal posture: Secondary | ICD-10-CM

## 2022-05-18 NOTE — Therapy (Signed)
OUTPATIENT PHYSICAL THERAPY FEMALE PELVIC TREATMENT   Patient Name: Rebecca Newton MRN: [id number] DOB:10/30/1945, 76 y.o., female Today's Date: 05/18/2022   PT End of Session - 05/18/22 0928     Visit Number 3    Date for PT Re-Evaluation 07/10/22    Authorization Type Medicare    Progress Note Due on Visit 10    PT Start Time 0930    PT Stop Time 0950    PT Time Calculation (min) 20 min    Activity Tolerance Patient tolerated treatment well    Behavior During Therapy [facility] for tasks assessed/performed             Past Medical History:  Diagnosis Date   Fibromyalgia    Interstitial cystitis    Lichen sclerosus    Osteopenia 12/2015   T score -1.2 FRAX 14%/1.6% stable from prior DEXA   Past Surgical History:  Procedure Laterality Date   BILATERAL SALPINGOOPHORECTOMY     BREAST BIOPSY Right    COMBINED HYSTEROSCOPY DIAGNOSTIC / D&C  2004   HYSTEROSCOPY     LAPAROSCOPIC ASSISTED VAGINAL HYSTERECTOMY  2007   Leiomyoma/adenomyosis   ROTATOR CUFF REPAIR  2006   Patient Active Problem List   Diagnosis Date Noted   Primary osteoarthritis of both knees 08/10/2017   Fecal smearing 08/19/2016   Rectocele 08/19/2016   Leukoplakic vulvitis 05/03/2012   Abdominal pain 10/14/2011   Chronic fatigue syndrome 10/14/2011   Chronic migraine without aura 10/14/2011   Dysuria 10/14/2011   Increased frequency of urination 10/14/2011   Irritable bowel syndrome 10/14/2011   Pain 10/14/2011   Urinary urgency 10/14/2011   Osteopenia    Interstitial cystitis    Fibromyalgia     PCP: Josetta Huddle, MD  REFERRING PROVIDER: Tamela Gammon, NP  REFERRING DIAG: R15.9,R15.0 (ICD-10-CM) - Fecal incontinence with incomplete defecation N81.6 (ICD-10-CM) - Rectocele  THERAPY DIAG:  Muscle weakness (generalized)  Unspecified lack of coordination  Abnormal posture  Rationale for Evaluation and Treatment Rehabilitation  ONSET DATE: several months  SUBJECTIVE:                                                                                                                                                                                            SUBJECTIVE STATEMENT: Pt reports she has been working on HEP at home but had a few questions. Pt also states she has had diarrhea yesterday and into this morning, has stopped but pt wore mask during session and requested to end early today after discussing HEP. Pt did not want to cancel last minute.     PAIN:  Are you having  pain? No  PRECAUTIONS: None  WEIGHT BEARING RESTRICTIONS No  FALLS:  Has patient fallen in last 6 months? No  LIVING ENVIRONMENT: Lives with: lives with their spouse Lives in: House/apartment   OCCUPATION: retired   PLOF: Independent  PATIENT GOALS to have less leakage and be stronger  PERTINENT HISTORY:  stage 2 rectocele and fecal incontinence, Subacute vulvitis,  IC, 1 FAVD, episiotomy.   Sexual abuse: No  BOWEL MOVEMENT Pain with bowel movement: No Type of bowel movement:Type (Bristol Stool Scale) 2-6, Frequency varies, and Strain No - was straining and stool was very loose prior to medication but this has improved Fully empty rectum: Yes: now with medication  Leakage: Yes: before medication was having leakage randomly but now has only had one instance of loose smearing in  a month  Pads: Yes: has a cotton ball at rectum to prevent leakage Fiber supplement: Yes: Miralax and Citrucel   URINATION Pain with urination: No Fully empty bladder: Yes:   Stream: Strong and Weak Urgency: No Frequency: 2 hours Leakage:  not reported  Pads: No  INTERCOURSE Pain with intercourse:  not active   PREGNANCY Vaginal deliveries 1 Tearing Yes: reports "200 stitches"  C-section deliveries 0 Currently pregnant No  PROLAPSE Rectocele stage 2 per chart    OBJECTIVE:   DIAGNOSTIC FINDINGS:     COGNITION:  Overall cognitive status: Within functional limits for tasks  assessed     SENSATION:  Light touch: Appears intact  Proprioception: Appears intact  MUSCLE LENGTH: Bil hamstrings and adductors limited by 25%               POSTURE: rounded shoulders, forward head, and posterior pelvic tilt   LUMBARAROM/PROM  A/PROM A/PROM  eval  Flexion Limited by 50%  Extension Limited by 25%  Right lateral flexion Limited by 50%  Left lateral flexion Limited by 50%  Right rotation Limited by 50%  Left rotation Limited by 50%   (Blank rows = not tested)  LOWER EXTREMITY ROM:  WFL  LOWER EXTREMITY MMT:  Bil hips grossly 4/5, knees and ankles 5/5   PALPATION:   General  no TTP                 External Perineal Exam deferred due to time                             Internal Pelvic Floor deferred due to time  Patient confirms identification and approves PT to assess internal pelvic floor and treatment No 05/12/2022 completed with pt consent.   No emotional/communication barriers or cognitive limitation. Patient is motivated to learn. Patient understands and agrees with treatment goals and plan. PT explains patient will be examined in standing, sitting, and lying down to see how their muscles and joints work. When they are ready, they will be asked to remove their underwear so PT can examine their perineum. The patient is also given the option of providing their own chaperone as one is not provided in our facility. The patient also has the right and is explained the right to defer or refuse any part of the evaluation or treatment including the internal exam. With the patient's consent, PT will use one gloved finger to gently assess the muscles of the pelvic floor, seeing how well it contracts and relaxes and if there is muscle symmetry. After, the patient will get dressed and PT and patient will discuss exam findings and  plan of care. PT and patient discuss plan of care, schedule, attendance policy and HEP activities.  PELVIC MMT:   MMT eval  Vaginal 3/5,  8s, 4 reps  Internal Anal Sphincter 3/5, 20s, 3 reps  External Anal Sphincter 3/5, 20s, 3 reps  Puborectalis 3/5, 20s, 3 reps  Diastasis Recti   (Blank rows = not tested)        TONE: Deferred due to time 05/12/2022: slightly decreased    PROLAPSE: Deferred due to time 05/12/2022:  grade 2 posterior wall laxity noted with strong cough in hooklying  TODAY'S TREATMENT   05/18/22:  PT educated pt on HEP to answer her questions - diaphragmatic breathing explained and visually demonstrated, pt completed 3 reps for carry over with good technique, bridges reviewed and pt simulated one rep, and reviewed proper technique of pelvic floor contractions in supine and in sitting. Pt also educated on attempting to elevate hips for 15 mins while resting for prolapse relief positions.   05/12/2022: Pt consented to internal vaginal and rectal assessment today. Findings above 2x10 pelvic floor contractions, quick flicks, Y30 1-6W isometrics vaginally X10 pelvic floor contractions, quick flicks, F09 32-35T isometrics rectally Pt educated on findings, HEP updates, POC, and lifting mechanics.    PATIENT EDUCATION:  Education details: [id number] Person educated: Patient Education method: Education officer, environmental, Corporate treasurer cues, Verbal cues, and Handouts Education comprehension: verbalized understanding and returned demonstration   HOME EXERCISE PROGRAM: [email or web address]  ASSESSMENT:  CLINICAL IMPRESSION: Patient reports she has been working on [employer] but wanted to ask a few questions to make sure she was doing everything correctly. Pt very motivated to participate and improve, states she has been improving lifting mechanics and has noted less strain at prolapse. All of pt's questions answered, HEP reviewed. Pt requested to end session early as she has had diarrhea since yesterday and has an appointment today for husband that she has to get him ready for. Pt thankful for time and very apologetic for ending  session but did not want to cancel last minute.  Pt would benefit from additional PT to further address deficits.      OBJECTIVE IMPAIRMENTS decreased coordination, decreased endurance, decreased strength, increased fascial restrictions, impaired flexibility, improper body mechanics, and postural dysfunction.   ACTIVITY LIMITATIONS continence and caring for others  PARTICIPATION LIMITATIONS: cleaning, community activity, yard work, and caregiver to spouse   PERSONAL FACTORS Age, Fitness, Time since onset of injury/illness/exacerbation, and 1 comorbidity: medical history, physical demands for caregiving   are also affecting patient's functional outcome.   REHAB POTENTIAL: Good  CLINICAL DECISION MAKING: Stable/uncomplicated  EVALUATION COMPLEXITY: Low   GOALS: Goals reviewed with patient? Yes  SHORT TERM GOALS: Target date: 05/08/2022  Pt to be I with HEP.  Baseline: Goal status: INITIAL  2.  Pt will report her BMs are complete and on average type 4 due to improved bowel habits and evacuation techniques.  Baseline:  Goal status: INITIAL  3.  Pt to demonstrate at least 3/5 pelvic floor strength for improved pelvic stability and decreased strain at pelvic floor/ decrease leakage.  Baseline:  Goal status: INITIAL  4.  Pt to be I with breathing mechanics and lifting mechanics with body weight squats without compensatory strategies to decreased strain at pelvic floor and rectocele.  Baseline:  Goal status: INITIAL    LONG TERM GOALS: Target date:  07/10/22    Pt to be I with advanced HEP.  Baseline:  Goal status: INITIAL  2.  Pt to demonstrate  at least 5/5 bil hip strength for improved pelvic stability and functional squats without leakage.  Baseline:  Goal status: INITIAL  3.  Pt to be I with breathing mechanics and lifting mechanics with 15# squats without compensatory strategies to decreased strain at pelvic floor and rectocele.  Baseline:  Goal status: INITIAL  4.    Pt to demonstrate at least 4/5 pelvic floor strength for improved pelvic stability and decreased strain at pelvic floor/ decrease leakage.  Baseline:  Goal status: INITIAL  5.  Pt to report no more than one instance of fecal leakage in on month to improve QOL. Baseline:  Goal status: INITIAL   PLAN: PT FREQUENCY: 1x/week  PT DURATION:  8 sessions  PLANNED INTERVENTIONS: Therapeutic exercises, Therapeutic activity, Neuromuscular re-education, Patient/Family education, Self Care, Joint mobilization, Dry Needling, Spinal mobilization, Cryotherapy, Moist heat, scar mobilization, Taping, Biofeedback, and Manual therapy  PLAN FOR NEXT SESSION: internal exam, breathing mechanics, pelvic floor coordination with functional tasks, hip and core strengthening, hip/spine stretching.  Stacy Gardner, PT, DPT 10/23/239:53 AM

## 2022-05-25 ENCOUNTER — Ambulatory Visit: Payer: Medicare Other | Admitting: Physical Therapy

## 2022-05-25 DIAGNOSIS — R293 Abnormal posture: Secondary | ICD-10-CM

## 2022-05-25 DIAGNOSIS — M6281 Muscle weakness (generalized): Secondary | ICD-10-CM | POA: Diagnosis not present

## 2022-05-25 DIAGNOSIS — R279 Unspecified lack of coordination: Secondary | ICD-10-CM | POA: Diagnosis not present

## 2022-05-25 NOTE — Therapy (Signed)
OUTPATIENT PHYSICAL THERAPY FEMALE PELVIC TREATMENT   Patient Name: Rebecca Newton MRN: [id number] DOB:September 25, 1945, 76 y.o., female Today's Date: 05/25/2022   PT End of Session - 05/25/22 0934     Visit Number 4    Date for PT Re-Evaluation 07/10/22    Authorization Type Medicare    Progress Note Due on Visit 10    PT Start Time 0931    PT Stop Time 1009    PT Time Calculation (min) 38 min    Activity Tolerance Patient tolerated treatment well    Behavior During Therapy [facility] for tasks assessed/performed             Past Medical History:  Diagnosis Date   Fibromyalgia    Interstitial cystitis    Lichen sclerosus    Osteopenia 12/2015   T score -1.2 FRAX 14%/1.6% stable from prior DEXA   Past Surgical History:  Procedure Laterality Date   BILATERAL SALPINGOOPHORECTOMY     BREAST BIOPSY Right    COMBINED HYSTEROSCOPY DIAGNOSTIC / D&C  2004   HYSTEROSCOPY     LAPAROSCOPIC ASSISTED VAGINAL HYSTERECTOMY  2007   Leiomyoma/adenomyosis   ROTATOR CUFF REPAIR  2006   Patient Active Problem List   Diagnosis Date Noted   Primary osteoarthritis of both knees 08/10/2017   Fecal smearing 08/19/2016   Rectocele 08/19/2016   Leukoplakic vulvitis 05/03/2012   Abdominal pain 10/14/2011   Chronic fatigue syndrome 10/14/2011   Chronic migraine without aura 10/14/2011   Dysuria 10/14/2011   Increased frequency of urination 10/14/2011   Irritable bowel syndrome 10/14/2011   Pain 10/14/2011   Urinary urgency 10/14/2011   Osteopenia    Interstitial cystitis    Fibromyalgia     PCP: Josetta Huddle, MD  REFERRING PROVIDER: Tamela Gammon, NP  REFERRING DIAG: R15.9,R15.0 (ICD-10-CM) - Fecal incontinence with incomplete defecation N81.6 (ICD-10-CM) - Rectocele  THERAPY DIAG:  Muscle weakness (generalized)  Abnormal posture  Unspecified lack of coordination  Rationale for Evaluation and Treatment Rehabilitation  ONSET DATE: several months  SUBJECTIVE:                                                                                                                                                                                            SUBJECTIVE STATEMENT: Pt reports her husband is now getting PT 3 days a week and she is just overwhelmed with his appointments right now and will need to be on hold/discharge early until she can have more time for her PT appointments. Pt has been implementing recommendations with lifting and pressure management.     PAIN:  Are you having pain? No  PRECAUTIONS: None  WEIGHT BEARING RESTRICTIONS No  FALLS:  Has patient fallen in last 6 months? No  LIVING ENVIRONMENT: Lives with: lives with their spouse Lives in: House/apartment   OCCUPATION: retired   PLOF: Independent  PATIENT GOALS to have less leakage and be stronger  PERTINENT HISTORY:  stage 2 rectocele and fecal incontinence, Subacute vulvitis,  IC, 1 FAVD, episiotomy.   Sexual abuse: No  BOWEL MOVEMENT Pain with bowel movement: No Type of bowel movement:Type (Bristol Stool Scale) 2-6, Frequency varies, and Strain No - was straining and stool was very loose prior to medication but this has improved Fully empty rectum: Yes: now with medication  Leakage: Yes: before medication was having leakage randomly but now has only had one instance of loose smearing in  a month  Pads: Yes: has a cotton ball at rectum to prevent leakage Fiber supplement: Yes: Miralax and Citrucel   URINATION Pain with urination: No Fully empty bladder: Yes:   Stream: Strong and Weak Urgency: No Frequency: 2 hours Leakage:  not reported  Pads: No  INTERCOURSE Pain with intercourse:  not active   PREGNANCY Vaginal deliveries 1 Tearing Yes: reports "200 stitches"  C-section deliveries 0 Currently pregnant No  PROLAPSE Rectocele stage 2 per chart    OBJECTIVE:   DIAGNOSTIC FINDINGS:     COGNITION:  Overall cognitive status: Within functional limits for  tasks assessed     SENSATION:  Light touch: Appears intact  Proprioception: Appears intact  MUSCLE LENGTH: Bil hamstrings and adductors limited by 25%               POSTURE: rounded shoulders, forward head, and posterior pelvic tilt   LUMBARAROM/PROM  A/PROM A/PROM  eval  Flexion Limited by 50%  Extension Limited by 25%  Right lateral flexion Limited by 50%  Left lateral flexion Limited by 50%  Right rotation Limited by 50%  Left rotation Limited by 50%   (Blank rows = not tested)  LOWER EXTREMITY ROM:  WFL  LOWER EXTREMITY MMT:  Bil hips grossly 4/5, knees and ankles 5/5   PALPATION:   General  no TTP                 External Perineal Exam deferred due to time                             Internal Pelvic Floor deferred due to time  Patient confirms identification and approves PT to assess internal pelvic floor and treatment No 05/12/2022 completed with pt consent.   No emotional/communication barriers or cognitive limitation. Patient is motivated to learn. Patient understands and agrees with treatment goals and plan. PT explains patient will be examined in standing, sitting, and lying down to see how their muscles and joints work. When they are ready, they will be asked to remove their underwear so PT can examine their perineum. The patient is also given the option of providing their own chaperone as one is not provided in our facility. The patient also has the right and is explained the right to defer or refuse any part of the evaluation or treatment including the internal exam. With the patient's consent, PT will use one gloved finger to gently assess the muscles of the pelvic floor, seeing how well it contracts and relaxes and if there is muscle symmetry. After, the patient will get dressed and PT and patient will discuss exam findings and plan of care.  PT and patient discuss plan of care, schedule, attendance policy and HEP activities.  PELVIC MMT:   MMT eval   Vaginal 3/5, 8s, 4 reps  Internal Anal Sphincter 3/5, 20s, 3 reps  External Anal Sphincter 3/5, 20s, 3 reps  Puborectalis 3/5, 20s, 3 reps  Diastasis Recti   (Blank rows = not tested)        TONE: Deferred due to time 05/12/2022: slightly decreased    PROLAPSE: Deferred due to time 05/12/2022:  grade 2 posterior wall laxity noted with strong cough in hooklying  TODAY'S TREATMENT   05/25/22  X10 of all HEP exercises to insure carry over and proper mechanics: bridges, quad knee ext, diaphragmatic breathing, TA contractions, hooklying pelvic floor contractions  Pt also educated on prolapse relief positions and hand out given.    PATIENT EDUCATION:  Education details: [id number] Person educated: Patient Education method: Education officer, environmental, Corporate treasurer cues, Verbal cues, and Handouts Education comprehension: verbalized understanding and returned demonstration   HOME EXERCISE PROGRAM: [email or web address]  ASSESSMENT:  CLINICAL IMPRESSION: Patient very apologetic but with husband's recent increase in medical needs and appointments she is unable to continue PT at this time as she is his only caregiver and she has no help. Pt reports she wants to make sure she is doing all of her exercises correctly and can continue at home to prevent prolapse from worsening, she has been doing lifting mechanics and pressure management and has not have fecal leakage or pressure feeling. Pt session focused on these aspects and education on prolapse relief positions. Pt tolerated well reported understanding and denied additional questions. Pt would like to return when her schedule is more able to have appointments for herself. This will serve as pt's DC from PT.    OBJECTIVE IMPAIRMENTS decreased coordination, decreased endurance, decreased strength, increased fascial restrictions, impaired flexibility, improper body mechanics, and postural dysfunction.   ACTIVITY LIMITATIONS continence and caring for  others  PARTICIPATION LIMITATIONS: cleaning, community activity, yard work, and caregiver to spouse   PERSONAL FACTORS Age, Fitness, Time since onset of injury/illness/exacerbation, and 1 comorbidity: medical history, physical demands for caregiving   are also affecting patient's functional outcome.   REHAB POTENTIAL: Good  CLINICAL DECISION MAKING: Stable/uncomplicated  EVALUATION COMPLEXITY: Low   GOALS: Goals reviewed with patient? Yes  SHORT TERM GOALS: Target date: 05/08/2022  Pt to be I with HEP.  Baseline: Goal status: MET  2.  Pt will report her BMs are complete and on average type 4 due to improved bowel habits and evacuation techniques.  Baseline:  Goal status: not met  3.  Pt to demonstrate at least 3/5 pelvic floor strength for improved pelvic stability and decreased strain at pelvic floor/ decrease leakage.  Baseline:  Goal status: not met  4.  Pt to be I with breathing mechanics and lifting mechanics with body weight squats without compensatory strategies to decreased strain at pelvic floor and rectocele.  Baseline:  Goal status: met     LONG TERM GOALS: Target date:  07/10/22    Pt to be I with advanced HEP.  Baseline:  Goal status: not met  2.  Pt to demonstrate at least 5/5 bil hip strength for improved pelvic stability and functional squats without leakage.  Baseline:  Goal status: not met  3.  Pt to be I with breathing mechanics and lifting mechanics with 15# squats without compensatory strategies to decreased strain at pelvic floor and rectocele.  Baseline:  Goal status: not met  4.  Pt to demonstrate at least 4/5 pelvic floor strength for improved pelvic stability and decreased strain at pelvic floor/ decrease leakage.  Baseline:  Goal status: not met  5.  Pt to report no more than one instance of fecal leakage in on month to improve QOL. Baseline:  Goal status: met    PLAN: PT FREQUENCY: 1x/week  PT DURATION:  8 sessions  PLANNED  INTERVENTIONS: Therapeutic exercises, Therapeutic activity, Neuromuscular re-education, Patient/Family education, Self Care, Joint mobilization, Dry Needling, Spinal mobilization, Cryotherapy, Moist heat, scar mobilization, Taping, Biofeedback, and Manual therapy  PLAN FOR NEXT SESSION: internal exam, breathing mechanics, pelvic floor coordination with functional tasks, hip and core strengthening, hip/spine stretching.  PHYSICAL THERAPY DISCHARGE SUMMARY  Visits from Start of Care: 4  Current functional level related to goals / functional outcomes: 2/4 STG met; 1/5 LTG however pt needed to DC from PT prior to end of care due to needs with her husband's health. Pt plans to return with new referral once able.    Remaining deficits: Prolapse still present however pt has started to see improvement with symptoms with this, has not had leakage since starting PT.    Education / Equipment: HEP   Patient agrees to discharge. Patient goals were partially met. Patient is being discharged due to the patient's request.   Stacy Gardner, PT, DPT 05/25/2309:54 AM

## 2022-06-01 ENCOUNTER — Encounter: Payer: Medicare Other | Admitting: Physical Therapy

## 2022-06-09 ENCOUNTER — Ambulatory Visit: Payer: PRIVATE HEALTH INSURANCE | Admitting: Rheumatology

## 2022-06-10 ENCOUNTER — Encounter: Payer: Medicare Other | Admitting: Physical Therapy

## 2022-06-11 NOTE — Progress Notes (Unsigned)
Office Visit Note  Patient: Rebecca Newton             Date of Birth: 09/28/1945           MRN: [id number]             PCP: Nena Jordan, MD Referring: Josetta Huddle, MD Visit Date: 06/25/2022 Occupation: _0 @  Subjective:  Routine follow-up  History of Present Illness: Rebecca Newton is a 76 y.o. female with history of fibromyalgia, osteoarthritis, and DDD.  She denies any increased joint pain or joint swelling at this time.  Patient continues to experience intermittent myalgias and muscle tenderness due to fibromyalgia.  Overall her symptoms have been tolerable and she has not had any recent flares.  She experiences intermittent trapezius muscle tension and tenderness especially when she is under increased stress.  She continues to act as the caregiver for her husband.  She experiences intermittent fatigue secondary to insomnia.  She is taking ambien 10 mg at bedtime for insomnia.   Activities of Daily Living:  Patient reports morning stiffness for 0 minutes.   Patient Denies nocturnal pain.  Difficulty dressing/grooming: Denies Difficulty climbing stairs: Denies Difficulty getting out of chair: Denies Difficulty using hands for taps, buttons, cutlery, and/or writing: Denies  Review of Systems  Constitutional:  Positive for fatigue.  HENT:  Negative for mouth sores and mouth dryness.   Eyes:  Negative for dryness.  Respiratory:  Negative for shortness of breath.   Cardiovascular:  Negative for chest pain and palpitations.  Gastrointestinal:  Positive for constipation. Negative for blood in stool and diarrhea.  Endocrine: Negative for increased urination.  Genitourinary:  Negative for involuntary urination.  Musculoskeletal:  Negative for joint pain, gait problem, joint pain, joint swelling, myalgias, muscle weakness, morning stiffness, muscle tenderness and myalgias.  Skin:  Negative for color change, rash, hair loss and sensitivity to sunlight.   Allergic/Immunologic: Negative for susceptible to infections.  Neurological:  Negative for dizziness and headaches.  Hematological:  Negative for swollen glands.  Psychiatric/Behavioral:  Positive for sleep disturbance. Negative for depressed mood. The patient is not nervous/anxious.     PMFS History:  Patient Active Problem List   Diagnosis Date Noted   Primary osteoarthritis of both knees 08/10/2017   Fecal smearing 08/19/2016   Rectocele 08/19/2016   Leukoplakic vulvitis 05/03/2012   Abdominal pain 10/14/2011   Chronic fatigue syndrome 10/14/2011   Chronic migraine without aura 10/14/2011   Dysuria 10/14/2011   Increased frequency of urination 10/14/2011   Irritable bowel syndrome 10/14/2011   Pain 10/14/2011   Urinary urgency 10/14/2011   Osteopenia    Interstitial cystitis    Fibromyalgia     Past Medical History:  Diagnosis Date   Fibromyalgia    Interstitial cystitis    Lichen sclerosus    Osteopenia 12/2015   T score -1.2 FRAX 14%/1.6% stable from prior DEXA    Family History  Problem Relation Age of Onset   Lung cancer Mother    Heart disease Mother    Stroke Father    Diabetes Maternal Grandmother    Heart disease Maternal Grandmother    Breast cancer Sister        Age 24   Cancer Son        Lung cancer   Past Surgical History:  Procedure Laterality Date   BILATERAL SALPINGOOPHORECTOMY     BREAST BIOPSY Right    COMBINED HYSTEROSCOPY DIAGNOSTIC / D&C  2004   HYSTEROSCOPY  LAPAROSCOPIC ASSISTED VAGINAL HYSTERECTOMY  2007   Leiomyoma/adenomyosis   ROTATOR CUFF REPAIR  2006   Social History   Social History Narrative   ** Merged History Encounter **       Lives with spouse Caffeine use: Drinks decaf passion tea No soda Right handed   Immunization History  Administered Date(s) Administered   PFIZER(Purple Top)SARS-COV-2 Vaccination 08/31/2019, 09/26/2019   Zoster Recombinat (Shingrix) 06/23/2017, 09/08/2017     Objective: Vital Signs:  BP 129/78 (BP Location: Right Arm, Patient Position: Sitting, Cuff Size: Normal)   Pulse 67   Resp 14   Ht _0  (1.575 m)   Wt 111 lb 3.2 oz (50.4 kg)   BMI 20.34 kg/m    Physical Exam Vitals and nursing note reviewed.  Constitutional:      Appearance: She is well-developed.  HENT:     Head: Normocephalic and atraumatic.  Eyes:     Conjunctiva/sclera: Conjunctivae normal.  Cardiovascular:     Rate and Rhythm: Normal rate and regular rhythm.     Heart sounds: Normal heart sounds.  Pulmonary:     Effort: Pulmonary effort is normal.     Breath sounds: Normal breath sounds.  Abdominal:     General: Bowel sounds are normal.     Palpations: Abdomen is soft.  Musculoskeletal:     Cervical back: Normal range of motion.  Skin:    General: Skin is warm and dry.     Capillary Refill: Capillary refill takes less than 2 seconds.  Neurological:     Mental Status: She is alert and oriented to person, place, and time.  Psychiatric:        Behavior: Behavior normal.      Musculoskeletal Exam: C-spine has good range of motion with some discomfort with lateral Tatian.  Trapezius muscle tension tenderness bilaterally.  Shoulder joints, elbow joints, wrist joints, MCPs, PIPs, DIPs have good range of motion with no synovitis.  Complete fist formation bilaterally.  Hip joints have good range of motion with no groin pain.  Knee joints have good range of motion with no warmth or effusion.  Ankle joints have good range of motion with no tenderness or joint swelling.  CDAI Exam: CDAI Score: -- Patient Global: --; Provider Global: -- Swollen: --; Tender: -- Joint Exam 06/25/2022   No joint exam has been documented for this visit   There is currently no information documented on the homunculus. Go to the Rheumatology activity and complete the homunculus joint exam.  Investigation: No additional findings.  Imaging: MM 3D SCREEN BREAST BILATERAL  Result Date: 06/16/2022 CLINICAL DATA:   Screening. EXAM: DIGITAL SCREENING BILATERAL MAMMOGRAM WITH TOMOSYNTHESIS AND CAD TECHNIQUE: Bilateral screening digital craniocaudal and mediolateral oblique mammograms were obtained. Bilateral screening digital breast tomosynthesis was performed. The images were evaluated with computer-aided detection. COMPARISON:  Previous exam(s). ACR Breast Density Category b: There are scattered areas of fibroglandular density. FINDINGS: There are no findings suspicious for malignancy. IMPRESSION: No mammographic evidence of malignancy. A result letter of this screening mammogram will be mailed directly to the patient. RECOMMENDATION: Screening mammogram in one year. (Code:SM-B-01Y) BI-RADS CATEGORY  1: Negative. Electronically Signed   By: Marin Olp M.D.   On: 06/16/2022 17:00    Recent Labs: Lab Results  Component Value Date   WBC 6.2 04/24/2010   HGB 14.2 04/24/2010   PLT 215 04/24/2010   NA 141 04/24/2010   K 3.6 04/24/2010   CL 111 04/24/2010   CO2 22 04/24/2010  GLUCOSE 99 04/24/2010   BUN 16 04/24/2010   CREATININE 0.83 04/24/2010   CALCIUM 9.0 04/24/2010   GFRAA  04/24/2010    >60        The eGFR has been calculated using the MDRD equation. This calculation has not been validated in all clinical situations. eGFR's persistently <60 mL/min signify possible Chronic Kidney Disease.    Speciality Comments: No specialty comments available.  Procedures:  No procedures performed Allergies: Macrodantin, Alprazolam, Ciprofibrate, Ciprofloxacin, Lyrica [pregabalin], Macrobid [nitrofurantoin], Topiramate, and Codeine   Assessment / Plan:     Visit Diagnoses: Fibromyalgia: She experiences intermittent allergies and muscle tenderness due to fibromyalgia.  She has trapezius muscle tension tenderness bilaterally.  Discussed the use of massage as well as a TENS unit.  She continues to have chronic fatigue secondary to insomnia.  She is been taking Ambien 10 mg at bedtime as needed for insomnia.   Discussed the importance of regular exercise and good sleep hygiene.  Also discussed the importance of stress management.  She was advised to notify us if she develops more frequent or severe flares.  She will follow-up in the office in 1 year or sooner if needed.  Other fatigue: Chronic, stable.  Primary insomnia -She takes Ambien 10 mg 1 tablet by mouth at bedtime as needed for insomnia.  Primary osteoarthritis of both knees - X-rays of both knees obtained on 09/03/16 and interpreted by Eliezer Lofts, PA-C: Moderate osteoarthritis and moderate patellofemoral narrowing noted.  She has good range of motion of both knee joints on examination today.  No warmth or effusion noted.  DDD (degenerative disc disease), cervical: Spine has good range of motion with some discomfort with lateral rotation.  Trapezius muscle tension tenderness bilaterally.  DDD (degenerative disc disease), lumbar - X-rays of the lumbar spine were performed at [facility] on 02/11/2018 ordered by Dr. Inda Merlin.  No midline spinal tenderness noted.  No symptoms of radiculopathy.  Other medical conditions are listed as follows:  Osteopenia of multiple sites   History of IBS  Interstitial cystitis - Diagnosed by Dr. Amalia Hailey in the past.  History of migraine  Orders: No orders of the defined types were placed in this encounter.  No orders of the defined types were placed in this encounter.   Follow-Up Instructions: Return in about 6 months (around 12/24/2022) for Osteoarthritis, Fibromyalgia, DDD.   Ofilia Neas, PA-C  Note - This record has been created using Dragon software.  Chart creation errors have been sought, but may not always  have been located. Such creation errors do not reflect on  the standard of medical care.

## 2022-06-15 ENCOUNTER — Ambulatory Visit: Payer: PRIVATE HEALTH INSURANCE | Admitting: Physician Assistant

## 2022-06-15 ENCOUNTER — Encounter: Payer: Medicare Other | Admitting: Physical Therapy

## 2022-06-15 ENCOUNTER — Ambulatory Visit
Admission: RE | Admit: 2022-06-15 | Discharge: 2022-06-15 | Disposition: A | Discharge status: Home / Self Care | Payer: Medicare Other | Source: Ambulatory Visit | Attending: Nurse Practitioner | Admitting: Nurse Practitioner

## 2022-06-15 DIAGNOSIS — Z1231 Encounter for screening mammogram for malignant neoplasm of breast: Secondary | ICD-10-CM | POA: Diagnosis not present

## 2022-06-22 ENCOUNTER — Ambulatory Visit: Payer: Medicare Other | Admitting: Physical Therapy

## 2022-06-22 DIAGNOSIS — Z23 Encounter for immunization: Secondary | ICD-10-CM | POA: Diagnosis not present

## 2022-06-25 ENCOUNTER — Ambulatory Visit: Payer: Medicare Other | Attending: Rheumatology | Admitting: Physician Assistant

## 2022-06-25 ENCOUNTER — Encounter: Payer: Self-pay | Admitting: Physician Assistant

## 2022-06-25 VITALS — BP 129/78 | HR 67 | Resp 14 | Ht 62.0 in | Wt 111.2 lb

## 2022-06-25 DIAGNOSIS — M8589 Other specified disorders of bone density and structure, multiple sites: Secondary | ICD-10-CM

## 2022-06-25 DIAGNOSIS — N3010 Interstitial cystitis (chronic) without hematuria: Secondary | ICD-10-CM | POA: Diagnosis not present

## 2022-06-25 DIAGNOSIS — M797 Fibromyalgia: Secondary | ICD-10-CM

## 2022-06-25 DIAGNOSIS — M5136 Other intervertebral disc degeneration, lumbar region: Secondary | ICD-10-CM | POA: Diagnosis not present

## 2022-06-25 DIAGNOSIS — R5383 Other fatigue: Secondary | ICD-10-CM | POA: Diagnosis not present

## 2022-06-25 DIAGNOSIS — F5101 Primary insomnia: Secondary | ICD-10-CM

## 2022-06-25 DIAGNOSIS — M170 Bilateral primary osteoarthritis of knee: Secondary | ICD-10-CM | POA: Diagnosis not present

## 2022-06-25 DIAGNOSIS — Z8719 Personal history of other diseases of the digestive system: Secondary | ICD-10-CM | POA: Diagnosis not present

## 2022-06-25 DIAGNOSIS — M5030 Other cervical disc degeneration, unspecified cervical region: Secondary | ICD-10-CM | POA: Diagnosis not present

## 2022-06-25 DIAGNOSIS — Z8669 Personal history of other diseases of the nervous system and sense organs: Secondary | ICD-10-CM

## 2022-06-25 DIAGNOSIS — M51369 Other intervertebral disc degeneration, lumbar region without mention of lumbar back pain or lower extremity pain: Secondary | ICD-10-CM

## 2022-06-29 ENCOUNTER — Encounter: Payer: Medicare Other | Admitting: Physical Therapy

## 2022-06-29 ENCOUNTER — Ambulatory Visit: Payer: Medicare Other | Admitting: Nurse Practitioner

## 2022-06-29 ENCOUNTER — Encounter: Payer: Self-pay | Admitting: Nurse Practitioner

## 2022-06-29 ENCOUNTER — Ambulatory Visit (INDEPENDENT_AMBULATORY_CARE_PROVIDER_SITE_OTHER): Payer: Medicare Other | Admitting: Nurse Practitioner

## 2022-06-29 VITALS — BP 118/74 | HR 66

## 2022-06-29 DIAGNOSIS — Z780 Asymptomatic menopausal state: Secondary | ICD-10-CM

## 2022-06-29 DIAGNOSIS — N763 Subacute and chronic vulvitis: Secondary | ICD-10-CM

## 2022-06-29 DIAGNOSIS — N816 Rectocele: Secondary | ICD-10-CM

## 2022-06-29 DIAGNOSIS — M8589 Other specified disorders of bone density and structure, multiple sites: Secondary | ICD-10-CM

## 2022-06-29 DIAGNOSIS — Z01419 Encounter for gynecological examination (general) (routine) without abnormal findings: Secondary | ICD-10-CM

## 2022-06-29 MED ORDER — CLOBETASOL PROPIONATE 0.05 % EX OINT: 1.0000 | TOPICAL_OINTMENT | CUTANEOUS | 0 refills | Status: AC

## 2022-06-29 NOTE — Progress Notes (Signed)
Rebecca Newton May 19, 1946 [id number]   History:  76 y.o. G1P1000 presents for breast and pelvic exam. Postmenopausal - no HRT. S/P 2007 LAVH BSO for adenomyosis and fibroids. Symptomatic rectocele. Started PT but was not able to continue due to husband's declining health. She has continued to do exercises and has had some improvement in fecal incontinence and being able to fully defecate. Has considered surgery but wants to hold off for now since she is primary caregiver. Cryosurgery >30 years ago. Using vaginal estrogen twice weekly, prescribed by urology. Using Clobetasol twice weekly with good management of chronic vulvitis. Declines further bone density screenings.   Gynecologic History No LMP recorded. Patient has had a hysterectomy.   Contraception: status post hysterectomy Sexually active: No  Health Maintenance Last Pap: 08/30/2017. Results were: Normal Last mammogram: 06/15/2022. Results were: Normal Last colonoscopy: 10/2021. Results were: Polyps Last Dexa: 01/07/2016. Results were: T-score -1.2, FRAX 14% / 1.2%  Past medical history, past surgical history, family history and social history were all reviewed and documented in the EPIC chart. Married. Son.   ROS:  A ROS was performed and pertinent positives and negatives are included.  Exam:  Vitals:   06/29/22 1130  BP: 118/74  Pulse: 66  SpO2: 99%   There is no height or weight on file to calculate BMI.  General appearance:  Normal Thyroid:  Symmetrical, normal in size, without palpable masses or nodularity. Respiratory  Auscultation:  Clear without wheezing or rhonchi Cardiovascular  Auscultation:  Regular rate, without rubs, murmurs or gallops  Edema/varicosities:  Not grossly evident Abdominal  Soft,nontender, without masses, guarding or rebound.  Liver/spleen:  No organomegaly noted  Hernia:  None appreciated  Skin  Inspection:  Grossly normal Breasts: Examined lying and sitting.   Right: Without  masses, retractions, nipple discharge or axillary adenopathy.   Left: Without masses, retractions, nipple discharge or axillary adenopathy. Genitourinary   Inguinal/mons:  Normal without inguinal adenopathy  External genitalia:  Normal appearing vulva with no masses, tenderness, or lesions  BUS/Urethra/Skene's glands:  Normal  Vagina:  Normal appearing with normal color and discharge, no lesions. 2+ rectocele, atrophic changes  Cervix:  And uterus absent  Adnexa/parametria:     Rt: Normal in size, without masses or tenderness.   Lt: Normal in size, without masses or tenderness.  Anus and perineum: Normal  Digital rectal exam: Deferred  Patient informed chaperone available to be present for breast and pelvic exam. Patient has requested no chaperone to be present. Patient has been advised what will be completed during breast and pelvic exam.   Assessment/Plan:  76 y.o. G1P1000 for breast and pelvic exam.   Encounter for breast and pelvic examination - Education provided on SBEs, importance of preventative screenings, current guidelines, high calcium diet, regular exercise, and multivitamin daily.  Labs with PCP.   Postmenopausal - no HRT  Osteopenia of multiple sites - declines further bone density screenings. Continue Vitamin D + Calcium and regular exercise.   Rectocele - Symptomatic rectocele. Started PT but was not able to continue due to husband's declining health. She has continued to do exercises and has had some improvement in fecal incontinence and being able to fully defecate. Has considered surgery but wants to hold off for now since she is primary caregiver. Therapist out for maternity leave and she wants to return in April to see her. Will send new referral then if needed.   Subacute vulvitis - Plan: clobetasol ointment (TEMOVATE) 0.05 % twice weekly with good  management.   Screening for cervical cancer - ryosurgery >30 years ago. No longer screening per guidelines.    Screening for breast cancer - Normal mammogram history.  Continue annual screenings.  Normal breast exam today.  Screening for colon cancer - 10/2021 colonoscopy. Will repeat at GI's recommended interval.   Return in 2 years for breast and pelvic exam.      Tamela Gammon DNP, 11:58 AM 06/29/2022

## 2022-06-30 DIAGNOSIS — N94819 Vulvodynia, unspecified: Secondary | ICD-10-CM | POA: Diagnosis not present

## 2022-06-30 DIAGNOSIS — N3010 Interstitial cystitis (chronic) without hematuria: Secondary | ICD-10-CM | POA: Diagnosis not present

## 2022-06-30 DIAGNOSIS — K582 Mixed irritable bowel syndrome: Secondary | ICD-10-CM | POA: Diagnosis not present

## 2022-06-30 DIAGNOSIS — M797 Fibromyalgia: Secondary | ICD-10-CM | POA: Diagnosis not present

## 2022-06-30 DIAGNOSIS — G9332 Myalgic encephalomyelitis/chronic fatigue syndrome: Secondary | ICD-10-CM | POA: Diagnosis not present

## 2022-06-30 DIAGNOSIS — R3915 Urgency of urination: Secondary | ICD-10-CM | POA: Diagnosis not present

## 2022-06-30 DIAGNOSIS — G43709 Chronic migraine without aura, not intractable, without status migrainosus: Secondary | ICD-10-CM | POA: Diagnosis not present

## 2022-06-30 DIAGNOSIS — R350 Frequency of micturition: Secondary | ICD-10-CM | POA: Diagnosis not present

## 2022-07-15 DIAGNOSIS — H43812 Vitreous degeneration, left eye: Secondary | ICD-10-CM | POA: Diagnosis not present

## 2022-07-15 DIAGNOSIS — H43811 Vitreous degeneration, right eye: Secondary | ICD-10-CM | POA: Diagnosis not present

## 2022-07-15 DIAGNOSIS — H0102B Squamous blepharitis left eye, upper and lower eyelids: Secondary | ICD-10-CM | POA: Diagnosis not present

## 2022-07-15 DIAGNOSIS — E119 Type 2 diabetes mellitus without complications: Secondary | ICD-10-CM | POA: Diagnosis not present

## 2022-07-15 DIAGNOSIS — Z961 Presence of intraocular lens: Secondary | ICD-10-CM | POA: Diagnosis not present

## 2022-07-15 DIAGNOSIS — H0102A Squamous blepharitis right eye, upper and lower eyelids: Secondary | ICD-10-CM | POA: Diagnosis not present

## 2022-07-15 DIAGNOSIS — H40013 Open angle with borderline findings, low risk, bilateral: Secondary | ICD-10-CM | POA: Diagnosis not present

## 2022-07-21 DIAGNOSIS — Z23 Encounter for immunization: Secondary | ICD-10-CM | POA: Diagnosis not present

## 2022-09-02 ENCOUNTER — Other Ambulatory Visit: Payer: Self-pay | Admitting: Nurse Practitioner

## 2022-09-02 DIAGNOSIS — Z1231 Encounter for screening mammogram for malignant neoplasm of breast: Secondary | ICD-10-CM

## 2022-09-03 DIAGNOSIS — E559 Vitamin D deficiency, unspecified: Secondary | ICD-10-CM | POA: Diagnosis not present

## 2022-09-03 DIAGNOSIS — Z5181 Encounter for therapeutic drug level monitoring: Secondary | ICD-10-CM | POA: Diagnosis not present

## 2022-09-03 DIAGNOSIS — E782 Mixed hyperlipidemia: Secondary | ICD-10-CM | POA: Diagnosis not present

## 2022-09-03 DIAGNOSIS — Z0000 Encounter for general adult medical examination without abnormal findings: Secondary | ICD-10-CM | POA: Diagnosis not present

## 2022-09-03 DIAGNOSIS — M797 Fibromyalgia: Secondary | ICD-10-CM | POA: Diagnosis not present

## 2022-09-03 DIAGNOSIS — F411 Generalized anxiety disorder: Secondary | ICD-10-CM | POA: Diagnosis not present

## 2022-09-03 DIAGNOSIS — G4700 Insomnia, unspecified: Secondary | ICD-10-CM | POA: Diagnosis not present

## 2022-09-03 DIAGNOSIS — R7303 Prediabetes: Secondary | ICD-10-CM | POA: Diagnosis not present

## 2023-01-07 DIAGNOSIS — L820 Inflamed seborrheic keratosis: Secondary | ICD-10-CM | POA: Diagnosis not present

## 2023-01-07 DIAGNOSIS — D485 Neoplasm of uncertain behavior of skin: Secondary | ICD-10-CM | POA: Diagnosis not present

## 2023-02-10 ENCOUNTER — Telehealth: Payer: Self-pay

## 2023-02-10 ENCOUNTER — Encounter: Payer: Self-pay | Admitting: Nurse Practitioner

## 2023-02-10 ENCOUNTER — Ambulatory Visit (INDEPENDENT_AMBULATORY_CARE_PROVIDER_SITE_OTHER): Payer: Medicare Other | Admitting: Nurse Practitioner

## 2023-02-10 VITALS — BP 110/68 | HR 76 | Wt 115.0 lb

## 2023-02-10 DIAGNOSIS — N644 Mastodynia: Secondary | ICD-10-CM

## 2023-02-10 DIAGNOSIS — S299XXA Unspecified injury of thorax, initial encounter: Secondary | ICD-10-CM | POA: Diagnosis not present

## 2023-02-10 NOTE — Telephone Encounter (Signed)
Spoke w/ LaToya @ [facility] and scheduled pt for 7/23 @ 730am.   LDVM on machine per DPR.  Will route to provider for final review and close encounter.

## 2023-02-10 NOTE — Progress Notes (Signed)
   Acute Office Visit  Subjective:    Patient ID: Rebecca Newton, female    DOB: [birth year], 77 y.o.   MRN: [id number]   HPI 77 y.o. presents today for right breast pain. Reports that about 2 months ago she lifted a concrete block and it smashed her breast. Denies any bruising, swelling, skin injury or bumps. Pain feels deep and has persisted. Slightly better. Pain is in upper outer region. Worse with certain movements, especially with lifting arm above head. Pain does not radiate to side, back or neck. Normal mammogram history. Most recent 06/15/2022. Category B density.   No LMP recorded. Patient has had a hysterectomy.    Review of Systems  Constitutional: Negative.   Right breast: Positive for pain. Negative for mass, redness, swelling, skin changes, nipple discharge.      Objective:    Physical Exam Constitutional:      Appearance: Normal appearance.  Chest:  Breasts:    Right: Tenderness present. No swelling, inverted nipple, mass, nipple discharge or skin change.       BP 110/68   Pulse 76   Wt 115 lb (52.2 kg)   SpO2 100%   BMI 21.03 kg/m  Wt Readings from Last 3 Encounters:  02/10/23 115 lb (52.2 kg)  06/25/22 111 lb 3.2 oz (50.4 kg)  06/10/21 113 lb (51.3 kg)        Patient informed chaperone available to be present for breast and/or pelvic exam. Patient has requested no chaperone to be present. Patient has been advised what will be completed during breast and pelvic exam.   Assessment & Plan:   Problem List Items Addressed This Visit   None Visit Diagnoses     Pain of right breast    -  Primary   Injury of breast, initial encounter          Plan: Likely inflammation s/t trauma versus costochondritis. Due to persistence, will send referral for right breast ultrasound. Recommend rest, NSAIDS, heat as needed.      Olivia Mackie DNP, 9:36 AM 02/10/2023

## 2023-02-10 NOTE — Telephone Encounter (Signed)
-----   Message from Olivia Mackie sent at 02/10/2023  9:36 AM EDT ----- Regarding: Right breast ultrasound Please send referral for right breast ultrasound for breast pain and injury. Thanks.

## 2023-02-16 ENCOUNTER — Ambulatory Visit: Admission: RE | Admit: 2023-02-16 | Payer: Medicare Other | Source: Ambulatory Visit

## 2023-02-16 ENCOUNTER — Other Ambulatory Visit: Payer: Medicare Other

## 2023-02-16 ENCOUNTER — Other Ambulatory Visit: Payer: Self-pay | Admitting: Nurse Practitioner

## 2023-02-16 ENCOUNTER — Ambulatory Visit
Admission: RE | Admit: 2023-02-16 | Discharge: 2023-02-16 | Disposition: A | Discharge status: Home / Self Care | Payer: Medicare Other | Source: Ambulatory Visit | Attending: Nurse Practitioner | Admitting: Nurse Practitioner

## 2023-02-16 DIAGNOSIS — S299XXA Unspecified injury of thorax, initial encounter: Secondary | ICD-10-CM

## 2023-02-16 DIAGNOSIS — N644 Mastodynia: Secondary | ICD-10-CM | POA: Diagnosis not present

## 2023-03-02 DIAGNOSIS — F411 Generalized anxiety disorder: Secondary | ICD-10-CM | POA: Diagnosis not present

## 2023-03-02 DIAGNOSIS — R7303 Prediabetes: Secondary | ICD-10-CM | POA: Diagnosis not present

## 2023-03-02 DIAGNOSIS — E559 Vitamin D deficiency, unspecified: Secondary | ICD-10-CM | POA: Diagnosis not present

## 2023-03-02 DIAGNOSIS — M797 Fibromyalgia: Secondary | ICD-10-CM | POA: Diagnosis not present

## 2023-03-02 DIAGNOSIS — G4700 Insomnia, unspecified: Secondary | ICD-10-CM | POA: Diagnosis not present

## 2023-03-02 DIAGNOSIS — E782 Mixed hyperlipidemia: Secondary | ICD-10-CM | POA: Diagnosis not present

## 2023-04-19 DIAGNOSIS — S63501A Unspecified sprain of right wrist, initial encounter: Secondary | ICD-10-CM | POA: Diagnosis not present

## 2023-05-03 DIAGNOSIS — H43811 Vitreous degeneration, right eye: Secondary | ICD-10-CM | POA: Diagnosis not present

## 2023-05-03 DIAGNOSIS — H40013 Open angle with borderline findings, low risk, bilateral: Secondary | ICD-10-CM | POA: Diagnosis not present

## 2023-05-03 DIAGNOSIS — H0102B Squamous blepharitis left eye, upper and lower eyelids: Secondary | ICD-10-CM | POA: Diagnosis not present

## 2023-05-03 DIAGNOSIS — H0102A Squamous blepharitis right eye, upper and lower eyelids: Secondary | ICD-10-CM | POA: Diagnosis not present

## 2023-05-03 DIAGNOSIS — H43812 Vitreous degeneration, left eye: Secondary | ICD-10-CM | POA: Diagnosis not present

## 2023-05-03 DIAGNOSIS — Z961 Presence of intraocular lens: Secondary | ICD-10-CM | POA: Diagnosis not present

## 2023-05-03 DIAGNOSIS — E119 Type 2 diabetes mellitus without complications: Secondary | ICD-10-CM | POA: Diagnosis not present

## 2023-06-15 NOTE — Progress Notes (Unsigned)
Office Visit Note  Patient: Rebecca Newton             Date of Birth: 1945-08-17           MRN: [id number]             PCP: Katha Cabal, MD Referring: Katha Cabal,* Visit Date: 06/29/2023 Occupation: @GUAROCC @  Subjective:     History of Present Illness: [name] is a 77 y.o. female with history of fibromyalgia and osteoarthritis.       Activities of Daily Living:  Patient reports morning stiffness for *** {minute/hour:19697}.   Patient {ACTIONS;DENIES/REPORTS:21021675::"Denies"} nocturnal pain.  Difficulty dressing/grooming: {ACTIONS;DENIES/REPORTS:21021675::"Denies"} Difficulty climbing stairs: {ACTIONS;DENIES/REPORTS:21021675::"Denies"} Difficulty getting out of chair: {ACTIONS;DENIES/REPORTS:21021675::"Denies"} Difficulty using hands for taps, buttons, cutlery, and/or writing: {ACTIONS;DENIES/REPORTS:21021675::"Denies"}  No Rheumatology ROS completed.   PMFS History:  Patient Active Problem List   Diagnosis Date Noted   Primary osteoarthritis of both knees 08/10/2017   Fecal smearing 08/19/2016   Rectocele 08/19/2016   Leukoplakic vulvitis 05/03/2012   Abdominal pain 10/14/2011   Chronic fatigue syndrome 10/14/2011   Chronic migraine without aura 10/14/2011   Dysuria 10/14/2011   Increased frequency of urination 10/14/2011   Irritable bowel syndrome 10/14/2011   Pain 10/14/2011   Urinary urgency 10/14/2011   Osteopenia    Interstitial cystitis    Fibromyalgia     Past Medical History:  Diagnosis Date   Fibromyalgia    Interstitial cystitis    Lichen sclerosus    Osteopenia 12/2015   T score -1.2 FRAX 14%/1.6% stable from prior DEXA    Family History  Problem Relation Age of Onset   Lung cancer Mother    Heart disease Mother    Stroke Father    Diabetes Maternal Grandmother    Heart disease Maternal Grandmother    Breast cancer Sister        Age 69   Cancer Son        Lung cancer   Past Surgical History:   Procedure Laterality Date   BILATERAL SALPINGOOPHORECTOMY     BREAST BIOPSY Right    COMBINED HYSTEROSCOPY DIAGNOSTIC / D&C  2004   HYSTEROSCOPY     LAPAROSCOPIC ASSISTED VAGINAL HYSTERECTOMY  2007   Leiomyoma/adenomyosis   ROTATOR CUFF REPAIR  2006   Social History   Social History Narrative   ** Merged History Encounter **       Lives with spouse Caffeine use: Drinks decaf passion tea No soda Right handed   Immunization History  Administered Date(s) Administered   PFIZER(Purple Top)SARS-COV-2 Vaccination 08/31/2019, 09/26/2019   Zoster Recombinant(Shingrix) 06/23/2017, 09/08/2017     Objective: Vital Signs: There were no vitals taken for this visit.   Physical Exam Vitals and nursing note reviewed.  Constitutional:      Appearance: She is well-developed.  HENT:     Head: Normocephalic and atraumatic.  Eyes:     Conjunctiva/sclera: Conjunctivae normal.  Cardiovascular:     Rate and Rhythm: Normal rate and regular rhythm.     Heart sounds: Normal heart sounds.  Pulmonary:     Effort: Pulmonary effort is normal.     Breath sounds: Normal breath sounds.  Abdominal:     General: Bowel sounds are normal.     Palpations: Abdomen is soft.  Musculoskeletal:     Cervical back: Normal range of motion.  Lymphadenopathy:     Cervical: No cervical adenopathy.  Skin:    General: Skin is warm and dry.  Capillary Refill: Capillary refill takes less than 2 seconds.  Neurological:     Mental Status: She is alert and oriented to person, place, and time.  Psychiatric:        Behavior: Behavior normal.      Musculoskeletal Exam: ***  CDAI Exam: CDAI Score: -- Patient Global: --; Provider Global: -- Swollen: --; Tender: -- Joint Exam 06/29/2023   No joint exam has been documented for this visit   There is currently no information documented on the homunculus. Go to the Rheumatology activity and complete the homunculus joint exam.  Investigation: No additional  findings.  Imaging: No results found.  Recent Labs: Lab Results  Component Value Date   WBC 6.2 04/24/2010   HGB 14.2 04/24/2010   PLT 215 04/24/2010   NA 141 04/24/2010   K 3.6 04/24/2010   CL 111 04/24/2010   CO2 22 04/24/2010   GLUCOSE 99 04/24/2010   BUN 16 04/24/2010   CREATININE 0.83 04/24/2010   CALCIUM 9.0 04/24/2010   GFRAA  04/24/2010    >60        The eGFR has been calculated using the MDRD equation. This calculation has not been validated in all clinical situations. eGFR's persistently <60 mL/min signify possible Chronic Kidney Disease.    Speciality Comments: No specialty comments available.  Procedures:  No procedures performed Allergies: Macrodantin, Alprazolam, Ciprofibrate, Ciprofloxacin, Lyrica [pregabalin], Macrobid [nitrofurantoin], Topiramate, and Codeine   Assessment / Plan:     Visit Diagnoses: Fibromyalgia  Other fatigue  Primary insomnia  Primary osteoarthritis of both knees  DDD (degenerative disc disease), cervical  Degeneration of intervertebral disc of lumbar region without discogenic back pain or lower extremity pain  Osteopenia of multiple sites  History of IBS  Interstitial cystitis  History of migraine  Orders: No orders of the defined types were placed in this encounter.  No orders of the defined types were placed in this encounter.   Face-to-face time spent with patient was *** minutes. Greater than 50% of time was spent in counseling and coordination of care.  Follow-Up Instructions: No follow-ups on file.   Gearldine Bienenstock, PA-C  Note - This record has been created using Dragon software.  Chart creation errors have been sought, but may not always  have been located. Such creation errors do not reflect on  the standard of medical care.

## 2023-06-17 ENCOUNTER — Ambulatory Visit
Admission: RE | Admit: 2023-06-17 | Discharge: 2023-06-17 | Disposition: A | Discharge status: Home / Self Care | Payer: Medicare Other | Source: Ambulatory Visit | Attending: Nurse Practitioner | Admitting: Nurse Practitioner

## 2023-06-17 DIAGNOSIS — Z1231 Encounter for screening mammogram for malignant neoplasm of breast: Secondary | ICD-10-CM

## 2023-06-29 ENCOUNTER — Ambulatory Visit: Payer: Medicare Other | Attending: Physician Assistant | Admitting: Physician Assistant

## 2023-06-29 ENCOUNTER — Encounter: Payer: Self-pay | Admitting: Physician Assistant

## 2023-06-29 ENCOUNTER — Ambulatory Visit: Payer: Medicare Other | Admitting: Physician Assistant

## 2023-06-29 ENCOUNTER — Other Ambulatory Visit: Payer: Self-pay | Admitting: Nurse Practitioner

## 2023-06-29 VITALS — BP 120/70 | HR 60 | Resp 14 | Ht 62.0 in | Wt 114.4 lb

## 2023-06-29 DIAGNOSIS — M5030 Other cervical disc degeneration, unspecified cervical region: Secondary | ICD-10-CM | POA: Insufficient documentation

## 2023-06-29 DIAGNOSIS — Z8669 Personal history of other diseases of the nervous system and sense organs: Secondary | ICD-10-CM | POA: Diagnosis not present

## 2023-06-29 DIAGNOSIS — N3010 Interstitial cystitis (chronic) without hematuria: Secondary | ICD-10-CM

## 2023-06-29 DIAGNOSIS — R5383 Other fatigue: Secondary | ICD-10-CM | POA: Insufficient documentation

## 2023-06-29 DIAGNOSIS — F5101 Primary insomnia: Secondary | ICD-10-CM | POA: Diagnosis not present

## 2023-06-29 DIAGNOSIS — Z8719 Personal history of other diseases of the digestive system: Secondary | ICD-10-CM

## 2023-06-29 DIAGNOSIS — M51369 Other intervertebral disc degeneration, lumbar region without mention of lumbar back pain or lower extremity pain: Secondary | ICD-10-CM

## 2023-06-29 DIAGNOSIS — M797 Fibromyalgia: Secondary | ICD-10-CM | POA: Diagnosis not present

## 2023-06-29 DIAGNOSIS — M8589 Other specified disorders of bone density and structure, multiple sites: Secondary | ICD-10-CM | POA: Insufficient documentation

## 2023-06-29 DIAGNOSIS — M170 Bilateral primary osteoarthritis of knee: Secondary | ICD-10-CM | POA: Diagnosis not present

## 2023-06-29 DIAGNOSIS — Z1231 Encounter for screening mammogram for malignant neoplasm of breast: Secondary | ICD-10-CM

## 2023-06-29 NOTE — Progress Notes (Signed)
Office Visit Note  Patient: Rebecca Newton             Date of Birth: [birth year]           MRN: [id number]             PCP: Katha Cabal, MD Referring: Katha Cabal,* Visit Date: 06/29/2023 Occupation: @GUAROCC @  Subjective:  Routine follow up   History of Present Illness: Rebecca Newton is a 77 y.o. female with history of fibromyalgia and osteoarthritis.  Patient experiences interval myalgias and muscle tenderness due to fibromyalgia but overall her symptoms have been stable.  She has not had any recent flares.  She takes Advil very sparingly for pain relief if needed.  She denies any joint swelling at this time.  Her energy level has been stable.  Activities of Daily Living:  Patient reports morning stiffness for 0 minutes.   Patient Denies nocturnal pain.  Difficulty dressing/grooming: Denies Difficulty climbing stairs: Denies Difficulty getting out of chair: Denies Difficulty using hands for taps, buttons, cutlery, and/or writing: Denies  Review of Systems  Constitutional:  Negative for fatigue.  HENT:  Negative for mouth sores, mouth dryness and nose dryness.   Eyes:  Negative for pain, visual disturbance and dryness.  Respiratory:  Negative for cough, hemoptysis, shortness of breath and difficulty breathing.   Cardiovascular:  Negative for chest pain, palpitations, hypertension and swelling in legs/feet.  Gastrointestinal:  Negative for blood in stool, constipation and diarrhea.  Endocrine: Negative for increased urination.  Genitourinary:  Negative for painful urination.  Musculoskeletal:  Positive for myalgias, muscle tenderness and myalgias. Negative for joint pain, joint pain, joint swelling, muscle weakness and morning stiffness.  Skin:  Positive for color change. Negative for pallor, rash, hair loss, nodules/bumps, skin tightness, ulcers and sensitivity to sunlight.  Allergic/Immunologic: Negative for susceptible to infections.   Neurological:  Negative for dizziness, numbness, headaches and weakness.  Hematological:  Negative for swollen glands.  Psychiatric/Behavioral:  Negative for depressed mood and sleep disturbance. The patient is not nervous/anxious.     PMFS History:  Patient Active Problem List   Diagnosis Date Noted   Primary osteoarthritis of both knees 08/10/2017   Fecal smearing 08/19/2016   Rectocele 08/19/2016   Leukoplakic vulvitis 05/03/2012   Abdominal pain 10/14/2011   Chronic fatigue syndrome 10/14/2011   Chronic migraine without aura 10/14/2011   Dysuria 10/14/2011   Increased frequency of urination 10/14/2011   Irritable bowel syndrome 10/14/2011   Pain 10/14/2011   Urinary urgency 10/14/2011   Osteopenia    Interstitial cystitis    Fibromyalgia     Past Medical History:  Diagnosis Date   Fibromyalgia    Interstitial cystitis    Lichen sclerosus    Osteopenia 12/2015   T score -1.2 FRAX 14%/1.6% stable from prior DEXA    Family History  Problem Relation Age of Onset   Lung cancer Mother    Heart disease Mother    Stroke Father    Diabetes Maternal Grandmother    Heart disease Maternal Grandmother    Breast cancer Sister        Age 74   Cancer Son        Lung cancer   Past Surgical History:  Procedure Laterality Date   BILATERAL SALPINGOOPHORECTOMY     BREAST BIOPSY Right    COMBINED HYSTEROSCOPY DIAGNOSTIC / D&C  2004   HYSTEROSCOPY     LAPAROSCOPIC ASSISTED VAGINAL HYSTERECTOMY  2007   Leiomyoma/adenomyosis  ROTATOR CUFF REPAIR  2006   Social History   Social History Narrative   ** Merged History Encounter **       Lives with spouse Caffeine use: Drinks decaf passion tea No soda Right handed   Immunization History  Administered Date(s) Administered   PFIZER(Purple Top)SARS-COV-2 Vaccination 08/31/2019, 09/26/2019   Zoster Recombinant(Shingrix) 06/23/2017, 09/08/2017     Objective: Vital Signs: BP 120/70 (BP Location: Right Arm, Patient Position:  Sitting, Cuff Size: Normal)   Pulse 60   Resp 14   Ht 5\' 2"  (1.575 m)   Wt 114 lb 6.4 oz (51.9 kg)   BMI 20.92 kg/m    Physical Exam Vitals and nursing note reviewed.  Constitutional:      Appearance: She is well-developed.  HENT:     Head: Normocephalic and atraumatic.  Eyes:     Conjunctiva/sclera: Conjunctivae normal.  Cardiovascular:     Rate and Rhythm: Normal rate and regular rhythm.     Heart sounds: Normal heart sounds.  Pulmonary:     Effort: Pulmonary effort is normal.     Breath sounds: Normal breath sounds.  Abdominal:     General: Bowel sounds are normal.     Palpations: Abdomen is soft.  Musculoskeletal:     Cervical back: Normal range of motion.  Lymphadenopathy:     Cervical: No cervical adenopathy.  Skin:    General: Skin is warm and dry.     Capillary Refill: Capillary refill takes less than 2 seconds.  Neurological:     Mental Status: She is alert and oriented to person, place, and time.  Psychiatric:        Behavior: Behavior normal.      Musculoskeletal Exam: C-spine, thoracic spine, and lumbar spine good ROM.  Shoulder joints, elbow joints, wrist joints, MCPs, PIPs, and DIPs good ROM with no synovitis.  Complete fist formation bilaterally.  Hip joints have good ROM with no groin pain.  Knee joints have good ROM with no warmth or effusion.  Ankle joints have good ROM with no tenderness or swelling.   CDAI Exam: CDAI Score: -- Patient Global: --; Provider Global: -- Swollen: --; Tender: -- Joint Exam 06/29/2023   No joint exam has been documented for this visit   There is currently no information documented on the homunculus. Go to the Rheumatology activity and complete the homunculus joint exam.  Investigation: No additional findings.  Imaging: MM 3D SCREEN BREAST BILATERAL  Result Date: 06/18/2023 CLINICAL DATA:  Screening. EXAM: DIGITAL SCREENING BILATERAL MAMMOGRAM WITH TOMOSYNTHESIS AND CAD TECHNIQUE: Bilateral screening digital  craniocaudal and mediolateral oblique mammograms were obtained. Bilateral screening digital breast tomosynthesis was performed. The images were evaluated with computer-aided detection. COMPARISON:  Previous exam(s). ACR Breast Density Category b: There are scattered areas of fibroglandular density. FINDINGS: There are no findings suspicious for malignancy. IMPRESSION: No mammographic evidence of malignancy. A result letter of this screening mammogram will be mailed directly to the patient. RECOMMENDATION: Screening mammogram in one year. (Code:SM-B-01Y) BI-RADS CATEGORY  1: Negative. Electronically Signed   By: Frederico Hamman M.D.   On: 06/18/2023 17:34    Recent Labs: Lab Results  Component Value Date   WBC 6.2 04/24/2010   HGB 14.2 04/24/2010   PLT 215 04/24/2010   NA 141 04/24/2010   K 3.6 04/24/2010   CL 111 04/24/2010   CO2 22 04/24/2010   GLUCOSE 99 04/24/2010   BUN 16 04/24/2010   CREATININE 0.83 04/24/2010   CALCIUM 9.0 04/24/2010  GFRAA  04/24/2010    >60        The eGFR has been calculated using the MDRD equation. This calculation has not been validated in all clinical situations. eGFR's persistently <60 mL/min signify possible Chronic Kidney Disease.    Speciality Comments: No specialty comments available.  Procedures:  No procedures performed Allergies: Macrodantin, Alprazolam, Ciprofibrate, Ciprofloxacin, Lyrica [pregabalin], Macrobid [nitrofurantoin], Topiramate, and Codeine   Assessment / Plan:     Visit Diagnoses: Fibromyalgia: She experiences intermittent myalgias and muscle tenderness due to fibromyalgia.  Overall her symptoms remain stable without severe flares.  Her energy level has been stable.  Overall she has been sleeping well at night taking Ambien 10 mg at bedtime as needed for insomnia.  She has not had any medication changes since her last office visit.  No new medical conditions.  Patient continues to follow-up with her PCP closely and has routine  lab work on a regular basis.  She will follow-up in the office in 1 year or sooner if needed.  Other fatigue: Her energy level has been stable.  Primary insomnia - She takes Ambien 10 mg 1 tablet by mouth at bedtime as needed for insomnia.  DDD (degenerative disc disease), cervical: C-spine has good range of motion with no discomfort currently.  No symptoms of radiculopathy.  Primary osteoarthritis of both knees - X-rays of both knees obtained on 09/03/16 and interpreted by Tawni Pummel, PA-C: Moderate osteoarthritis and moderate patellofemoral narrowing noted. Good range of motion of both knee joints on examination today.  No warmth or effusion noted.  Degeneration of intervertebral disc of lumbar region without discogenic back pain or lower extremity pain - X-rays of the lumbar spine were performed at [facility] imaging on 02/11/2018 ordered by Dr. Kevan Ny.  She has not been experiencing any increased discomfort in her lower back recently.  She has no symptoms of radiculopathy.  Other medical conditions are listed as follows:   Osteopenia of multiple sites  History of IBS  Interstitial cystitis  History of migraine  Orders: No orders of the defined types were placed in this encounter.  No orders of the defined types were placed in this encounter.     Follow-Up Instructions: Return in about 1 year (around 06/28/2024) for Fibromyalgia, Osteoarthritis.   Gearldine Bienenstock, PA-C  Note - This record has been created using Dragon software.  Chart creation errors have been sought, but may not always  have been located. Such creation errors do not reflect on  the standard of medical care.

## 2023-07-26 DIAGNOSIS — H0102A Squamous blepharitis right eye, upper and lower eyelids: Secondary | ICD-10-CM | POA: Diagnosis not present

## 2023-07-26 DIAGNOSIS — H0102B Squamous blepharitis left eye, upper and lower eyelids: Secondary | ICD-10-CM | POA: Diagnosis not present

## 2023-07-26 DIAGNOSIS — H43812 Vitreous degeneration, left eye: Secondary | ICD-10-CM | POA: Diagnosis not present

## 2023-07-26 DIAGNOSIS — E119 Type 2 diabetes mellitus without complications: Secondary | ICD-10-CM | POA: Diagnosis not present

## 2023-07-26 DIAGNOSIS — Z961 Presence of intraocular lens: Secondary | ICD-10-CM | POA: Diagnosis not present

## 2023-07-26 DIAGNOSIS — H40013 Open angle with borderline findings, low risk, bilateral: Secondary | ICD-10-CM | POA: Diagnosis not present

## 2023-07-26 DIAGNOSIS — H43811 Vitreous degeneration, right eye: Secondary | ICD-10-CM | POA: Diagnosis not present

## 2023-09-06 DIAGNOSIS — Z79899 Other long term (current) drug therapy: Secondary | ICD-10-CM | POA: Diagnosis not present

## 2023-09-06 DIAGNOSIS — Z0000 Encounter for general adult medical examination without abnormal findings: Secondary | ICD-10-CM | POA: Diagnosis not present

## 2023-09-06 DIAGNOSIS — E559 Vitamin D deficiency, unspecified: Secondary | ICD-10-CM | POA: Diagnosis not present

## 2023-09-06 DIAGNOSIS — Z23 Encounter for immunization: Secondary | ICD-10-CM | POA: Diagnosis not present

## 2023-09-06 DIAGNOSIS — F411 Generalized anxiety disorder: Secondary | ICD-10-CM | POA: Diagnosis not present

## 2023-09-06 DIAGNOSIS — M797 Fibromyalgia: Secondary | ICD-10-CM | POA: Diagnosis not present

## 2023-09-06 DIAGNOSIS — E782 Mixed hyperlipidemia: Secondary | ICD-10-CM | POA: Diagnosis not present

## 2023-09-06 DIAGNOSIS — R7303 Prediabetes: Secondary | ICD-10-CM | POA: Diagnosis not present

## 2023-09-06 DIAGNOSIS — R634 Abnormal weight loss: Secondary | ICD-10-CM | POA: Diagnosis not present

## 2023-09-06 DIAGNOSIS — G4700 Insomnia, unspecified: Secondary | ICD-10-CM | POA: Diagnosis not present

## 2024-03-06 DIAGNOSIS — G4700 Insomnia, unspecified: Secondary | ICD-10-CM | POA: Diagnosis not present

## 2024-03-06 DIAGNOSIS — F411 Generalized anxiety disorder: Secondary | ICD-10-CM | POA: Diagnosis not present

## 2024-03-06 DIAGNOSIS — Z79899 Other long term (current) drug therapy: Secondary | ICD-10-CM | POA: Diagnosis not present

## 2024-03-06 DIAGNOSIS — R7303 Prediabetes: Secondary | ICD-10-CM | POA: Diagnosis not present

## 2024-03-06 DIAGNOSIS — E782 Mixed hyperlipidemia: Secondary | ICD-10-CM | POA: Diagnosis not present

## 2024-03-06 DIAGNOSIS — M797 Fibromyalgia: Secondary | ICD-10-CM | POA: Diagnosis not present

## 2024-03-06 DIAGNOSIS — F4321 Adjustment disorder with depressed mood: Secondary | ICD-10-CM | POA: Diagnosis not present

## 2024-03-06 DIAGNOSIS — E559 Vitamin D deficiency, unspecified: Secondary | ICD-10-CM | POA: Diagnosis not present

## 2024-03-10 DIAGNOSIS — H0102A Squamous blepharitis right eye, upper and lower eyelids: Secondary | ICD-10-CM | POA: Diagnosis not present

## 2024-03-10 DIAGNOSIS — Z961 Presence of intraocular lens: Secondary | ICD-10-CM | POA: Diagnosis not present

## 2024-03-10 DIAGNOSIS — H40013 Open angle with borderline findings, low risk, bilateral: Secondary | ICD-10-CM | POA: Diagnosis not present

## 2024-03-10 DIAGNOSIS — H0102B Squamous blepharitis left eye, upper and lower eyelids: Secondary | ICD-10-CM | POA: Diagnosis not present

## 2024-05-08 DIAGNOSIS — H40013 Open angle with borderline findings, low risk, bilateral: Secondary | ICD-10-CM | POA: Diagnosis not present

## 2024-05-08 DIAGNOSIS — Z961 Presence of intraocular lens: Secondary | ICD-10-CM | POA: Diagnosis not present

## 2024-05-08 DIAGNOSIS — H0102B Squamous blepharitis left eye, upper and lower eyelids: Secondary | ICD-10-CM | POA: Diagnosis not present

## 2024-05-08 DIAGNOSIS — H0102A Squamous blepharitis right eye, upper and lower eyelids: Secondary | ICD-10-CM | POA: Diagnosis not present

## 2024-06-07 DIAGNOSIS — G4700 Insomnia, unspecified: Secondary | ICD-10-CM | POA: Diagnosis not present

## 2024-06-07 DIAGNOSIS — F411 Generalized anxiety disorder: Secondary | ICD-10-CM | POA: Diagnosis not present

## 2024-06-07 DIAGNOSIS — R7303 Prediabetes: Secondary | ICD-10-CM | POA: Diagnosis not present

## 2024-06-07 DIAGNOSIS — E782 Mixed hyperlipidemia: Secondary | ICD-10-CM | POA: Diagnosis not present

## 2024-06-07 DIAGNOSIS — M797 Fibromyalgia: Secondary | ICD-10-CM | POA: Diagnosis not present

## 2024-06-07 DIAGNOSIS — F4321 Adjustment disorder with depressed mood: Secondary | ICD-10-CM | POA: Diagnosis not present

## 2024-06-07 DIAGNOSIS — E559 Vitamin D deficiency, unspecified: Secondary | ICD-10-CM | POA: Diagnosis not present

## 2024-06-07 DIAGNOSIS — Z79899 Other long term (current) drug therapy: Secondary | ICD-10-CM | POA: Diagnosis not present

## 2024-06-07 DIAGNOSIS — R630 Anorexia: Secondary | ICD-10-CM | POA: Diagnosis not present

## 2024-06-08 DIAGNOSIS — Z7189 Other specified counseling: Secondary | ICD-10-CM | POA: Diagnosis not present

## 2024-06-08 DIAGNOSIS — F4329 Adjustment disorder with other symptoms: Secondary | ICD-10-CM | POA: Diagnosis not present

## 2024-06-08 DIAGNOSIS — L738 Other specified follicular disorders: Secondary | ICD-10-CM | POA: Diagnosis not present

## 2024-06-19 ENCOUNTER — Ambulatory Visit
Admission: RE | Admit: 2024-06-19 | Discharge: 2024-06-19 | Disposition: A | Discharge status: Home / Self Care | Payer: Medicare Other | Source: Ambulatory Visit | Attending: Nurse Practitioner | Admitting: Nurse Practitioner

## 2024-06-19 DIAGNOSIS — Z1231 Encounter for screening mammogram for malignant neoplasm of breast: Secondary | ICD-10-CM | POA: Diagnosis not present

## 2024-08-03 ENCOUNTER — Other Ambulatory Visit: Payer: Self-pay | Admitting: Nurse Practitioner

## 2024-08-03 DIAGNOSIS — Z1231 Encounter for screening mammogram for malignant neoplasm of breast: Secondary | ICD-10-CM

## 2025-06-20 ENCOUNTER — Ambulatory Visit
